# Patient Record
Sex: Female | Born: 1980 | Race: Black or African American | Hispanic: No | Marital: Single | Smoking: Never smoker
Health system: Southern US, Community
[De-identification: ages and names within clinical notes are randomized; demographics above are authoritative.]

## PROBLEM LIST (undated history)

## (undated) DIAGNOSIS — D649 Anemia, unspecified: Secondary | ICD-10-CM

## (undated) DIAGNOSIS — F329 Major depressive disorder, single episode, unspecified: Secondary | ICD-10-CM

## (undated) DIAGNOSIS — Q0700 Arnold-Chiari syndrome without spina bifida or hydrocephalus: Secondary | ICD-10-CM

## (undated) DIAGNOSIS — I10 Essential (primary) hypertension: Secondary | ICD-10-CM

## (undated) DIAGNOSIS — E669 Obesity, unspecified: Secondary | ICD-10-CM

## (undated) DIAGNOSIS — F32A Depression, unspecified: Secondary | ICD-10-CM

## (undated) DIAGNOSIS — F419 Anxiety disorder, unspecified: Secondary | ICD-10-CM

## (undated) DIAGNOSIS — G43909 Migraine, unspecified, not intractable, without status migrainosus: Secondary | ICD-10-CM

## (undated) HISTORY — DX: Anxiety disorder, unspecified: F41.9

## (undated) HISTORY — PX: OTHER SURGICAL HISTORY: SHX169

## (undated) HISTORY — DX: Major depressive disorder, single episode, unspecified: F32.9

## (undated) HISTORY — DX: Obesity, unspecified: E66.9

## (undated) HISTORY — DX: Arnold-Chiari syndrome without spina bifida or hydrocephalus: Q07.00

## (undated) HISTORY — DX: Depression, unspecified: F32.A

---
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
## 2001-02-26 ENCOUNTER — Encounter (INDEPENDENT_AMBULATORY_CARE_PROVIDER_SITE_OTHER): Payer: Self-pay | Admitting: *Deleted

## 2001-02-26 ENCOUNTER — Other Ambulatory Visit: Admission: RE | Admit: 2001-02-26 | Discharge: 2001-02-26 | Payer: Self-pay | Admitting: *Deleted

## 2001-02-26 ENCOUNTER — Encounter: Admission: RE | Admit: 2001-02-26 | Discharge: 2001-02-26 | Payer: Self-pay | Admitting: Family Medicine

## 2001-03-17 ENCOUNTER — Encounter: Admission: RE | Admit: 2001-03-17 | Discharge: 2001-03-17 | Payer: Self-pay | Admitting: Family Medicine

## 2001-04-21 ENCOUNTER — Encounter: Admission: RE | Admit: 2001-04-21 | Discharge: 2001-04-21 | Payer: Self-pay | Admitting: Family Medicine

## 2001-05-15 ENCOUNTER — Encounter: Admission: RE | Admit: 2001-05-15 | Discharge: 2001-05-15 | Payer: Self-pay | Admitting: Family Medicine

## 2001-07-28 ENCOUNTER — Encounter: Admission: RE | Admit: 2001-07-28 | Discharge: 2001-07-28 | Payer: Self-pay | Admitting: Family Medicine

## 2001-09-28 ENCOUNTER — Encounter: Admission: RE | Admit: 2001-09-28 | Discharge: 2001-09-28 | Payer: Self-pay | Admitting: Family Medicine

## 2001-12-09 ENCOUNTER — Encounter: Admission: RE | Admit: 2001-12-09 | Discharge: 2001-12-09 | Payer: Self-pay | Admitting: Family Medicine

## 2002-05-10 ENCOUNTER — Encounter: Admission: RE | Admit: 2002-05-10 | Discharge: 2002-05-10 | Payer: Self-pay | Admitting: Family Medicine

## 2002-08-13 ENCOUNTER — Encounter: Admission: RE | Admit: 2002-08-13 | Discharge: 2002-08-13 | Payer: Self-pay | Admitting: Family Medicine

## 2002-10-18 ENCOUNTER — Encounter: Payer: Self-pay | Admitting: Emergency Medicine

## 2002-10-18 ENCOUNTER — Emergency Department (HOSPITAL_COMMUNITY): Admission: EM | Admit: 2002-10-18 | Discharge: 2002-10-18 | Payer: Self-pay | Admitting: Emergency Medicine

## 2002-12-22 ENCOUNTER — Encounter: Admission: RE | Admit: 2002-12-22 | Discharge: 2002-12-22 | Payer: Self-pay | Admitting: Family Medicine

## 2002-12-29 ENCOUNTER — Encounter: Admission: RE | Admit: 2002-12-29 | Discharge: 2002-12-29 | Payer: Self-pay | Admitting: Family Medicine

## 2003-01-06 ENCOUNTER — Encounter: Admission: RE | Admit: 2003-01-06 | Discharge: 2003-01-06 | Payer: Self-pay | Admitting: Family Medicine

## 2003-01-13 ENCOUNTER — Encounter: Admission: RE | Admit: 2003-01-13 | Discharge: 2003-01-13 | Payer: Self-pay | Admitting: Family Medicine

## 2003-02-25 ENCOUNTER — Encounter: Admission: RE | Admit: 2003-02-25 | Discharge: 2003-02-25 | Payer: Self-pay | Admitting: Family Medicine

## 2003-07-01 ENCOUNTER — Encounter (INDEPENDENT_AMBULATORY_CARE_PROVIDER_SITE_OTHER): Payer: Self-pay | Admitting: Specialist

## 2003-07-01 ENCOUNTER — Encounter: Admission: RE | Admit: 2003-07-01 | Discharge: 2003-07-01 | Payer: Self-pay | Admitting: Family Medicine

## 2004-04-13 ENCOUNTER — Ambulatory Visit: Payer: Self-pay | Admitting: Family Medicine

## 2005-05-29 ENCOUNTER — Ambulatory Visit: Payer: Self-pay | Admitting: Family Medicine

## 2005-08-22 ENCOUNTER — Encounter (INDEPENDENT_AMBULATORY_CARE_PROVIDER_SITE_OTHER): Payer: Self-pay | Admitting: *Deleted

## 2005-08-22 LAB — CONVERTED CEMR LAB

## 2005-08-27 ENCOUNTER — Ambulatory Visit: Payer: Self-pay | Admitting: Sports Medicine

## 2005-08-27 ENCOUNTER — Other Ambulatory Visit: Admission: RE | Admit: 2005-08-27 | Discharge: 2005-08-27 | Payer: Self-pay | Admitting: Family Medicine

## 2006-08-14 ENCOUNTER — Ambulatory Visit: Payer: Self-pay | Admitting: Family Medicine

## 2006-09-05 ENCOUNTER — Encounter (INDEPENDENT_AMBULATORY_CARE_PROVIDER_SITE_OTHER): Payer: Self-pay | Admitting: Family Medicine

## 2006-09-05 ENCOUNTER — Encounter (INDEPENDENT_AMBULATORY_CARE_PROVIDER_SITE_OTHER): Payer: Self-pay | Admitting: *Deleted

## 2006-09-05 ENCOUNTER — Other Ambulatory Visit: Admission: RE | Admit: 2006-09-05 | Discharge: 2006-09-05 | Payer: Self-pay | Admitting: Family Medicine

## 2006-09-05 ENCOUNTER — Ambulatory Visit: Payer: Self-pay | Admitting: Family Medicine

## 2006-09-05 LAB — CONVERTED CEMR LAB
Chlamydia, DNA Probe: NEGATIVE
Cholesterol: 153 mg/dL (ref 0–200)
GC Probe Amp, Genital: NEGATIVE
HDL: 43 mg/dL (ref >39)
LDL Cholesterol: 97 mg/dL (ref 0–99)
Total CHOL/HDL Ratio: 3.6
Triglycerides: 64 mg/dL (ref <150)
VLDL: 13 mg/dL (ref 0–40)

## 2006-09-18 DIAGNOSIS — E669 Obesity, unspecified: Secondary | ICD-10-CM

## 2006-09-19 ENCOUNTER — Encounter (INDEPENDENT_AMBULATORY_CARE_PROVIDER_SITE_OTHER): Payer: Self-pay | Admitting: *Deleted

## 2006-10-31 ENCOUNTER — Ambulatory Visit: Payer: Self-pay | Admitting: Family Medicine

## 2007-01-09 ENCOUNTER — Ambulatory Visit: Payer: Self-pay | Admitting: Family Medicine

## 2007-04-07 ENCOUNTER — Encounter (INDEPENDENT_AMBULATORY_CARE_PROVIDER_SITE_OTHER): Payer: Self-pay | Admitting: Family Medicine

## 2007-04-07 ENCOUNTER — Ambulatory Visit: Payer: Self-pay | Admitting: Family Medicine

## 2007-04-07 DIAGNOSIS — I10 Essential (primary) hypertension: Secondary | ICD-10-CM

## 2007-04-07 DIAGNOSIS — R87610 Atypical squamous cells of undetermined significance on cytologic smear of cervix (ASC-US): Secondary | ICD-10-CM

## 2007-04-09 ENCOUNTER — Encounter (INDEPENDENT_AMBULATORY_CARE_PROVIDER_SITE_OTHER): Payer: Self-pay | Admitting: Family Medicine

## 2007-04-28 ENCOUNTER — Encounter (INDEPENDENT_AMBULATORY_CARE_PROVIDER_SITE_OTHER): Payer: Self-pay | Admitting: Family Medicine

## 2007-07-09 ENCOUNTER — Telehealth: Payer: Self-pay | Admitting: *Deleted

## 2007-07-14 ENCOUNTER — Ambulatory Visit: Payer: Self-pay | Admitting: Sports Medicine

## 2007-09-11 ENCOUNTER — Ambulatory Visit: Payer: Self-pay | Admitting: Family Medicine

## 2007-10-09 ENCOUNTER — Telehealth: Payer: Self-pay | Admitting: Family Medicine

## 2007-10-09 ENCOUNTER — Encounter: Payer: Self-pay | Admitting: Family Medicine

## 2007-10-09 ENCOUNTER — Ambulatory Visit: Payer: Self-pay | Admitting: Family Medicine

## 2007-10-09 DIAGNOSIS — N926 Irregular menstruation, unspecified: Secondary | ICD-10-CM | POA: Insufficient documentation

## 2007-10-09 LAB — CONVERTED CEMR LAB
Beta hcg, urine, semiquantitative: NEGATIVE
CO2: 25 meq/L (ref 19–32)
Chloride: 105 meq/L (ref 96–112)
GC Probe Amp, Genital: NEGATIVE
Sodium: 139 meq/L (ref 135–145)
Whiff Test: POSITIVE

## 2008-02-18 ENCOUNTER — Encounter (INDEPENDENT_AMBULATORY_CARE_PROVIDER_SITE_OTHER): Payer: Self-pay | Admitting: Family Medicine

## 2008-09-15 ENCOUNTER — Ambulatory Visit: Payer: Self-pay | Admitting: Family Medicine

## 2008-09-29 ENCOUNTER — Ambulatory Visit: Payer: Self-pay | Admitting: Family Medicine

## 2008-10-13 ENCOUNTER — Ambulatory Visit: Payer: Self-pay | Admitting: Family Medicine

## 2008-10-13 ENCOUNTER — Encounter (INDEPENDENT_AMBULATORY_CARE_PROVIDER_SITE_OTHER): Payer: Self-pay | Admitting: Family Medicine

## 2008-10-13 LAB — CONVERTED CEMR LAB
Pap Smear: NORMAL
Whiff Test: NEGATIVE

## 2008-10-14 ENCOUNTER — Encounter: Payer: Self-pay | Admitting: *Deleted

## 2008-10-14 LAB — CONVERTED CEMR LAB
ALT: 17 units/L (ref 0–35)
CO2: 24 meq/L (ref 19–32)
Calcium: 9 mg/dL (ref 8.4–10.5)
Chlamydia, DNA Probe: NEGATIVE
Chloride: 103 meq/L (ref 96–112)
Cholesterol: 140 mg/dL (ref 0–200)
Lymphocytes Relative: 44 % (ref 12–46)
Lymphs Abs: 2 10*3/uL (ref 0.7–4.0)
Monocytes Relative: 9 % (ref 3–12)
Neutro Abs: 2.1 10*3/uL (ref 1.7–7.7)
Neutrophils Relative %: 47 % (ref 43–77)
Potassium: 3.8 meq/L (ref 3.5–5.3)
RBC: 3.83 M/uL — ABNORMAL LOW (ref 3.87–5.11)
Sodium: 138 meq/L (ref 135–145)
Total Bilirubin: 0.5 mg/dL (ref 0.3–1.2)
Total Protein: 8.3 g/dL (ref 6.0–8.3)
VLDL: 16 mg/dL (ref 0–40)
WBC: 4.5 10*3/uL (ref 4.0–10.5)

## 2009-02-13 ENCOUNTER — Encounter: Admission: RE | Admit: 2009-02-13 | Discharge: 2009-02-13 | Payer: Self-pay | Admitting: Family Medicine

## 2009-09-11 ENCOUNTER — Encounter: Admission: RE | Admit: 2009-09-11 | Discharge: 2009-09-11 | Payer: Self-pay | Admitting: Family Medicine

## 2010-05-25 ENCOUNTER — Encounter: Payer: Self-pay | Admitting: Family Medicine

## 2010-08-21 NOTE — Miscellaneous (Signed)
  Clinical Lists Changes  Problems: Removed problem of MENORRHAGIA (ICD-626.2) Removed problem of SCREENING FOR MALIGNANT NEOPLASM OF THE CERVIX (ICD-V76.2) Removed problem of TRICHOMONAL VAGINITIS (ICD-131.01) Removed problem of SEXUALLY TRANSMITTED DISEASE, EXPOSURE TO (ICD-V01.6) Removed problem of VAGINAL DISCHARGE (ICD-623.5) Removed problem of SCREENING FOR MALIGNANT NEOPLASM OF THE CERVIX (ICD-V76.2) Removed problem of PAPANICOLAOU SMEAR, ABNORMAL (ICD-795.0) Removed problem of AMENORRHEA (ICD-626.0)

## 2012-10-15 ENCOUNTER — Ambulatory Visit: Payer: 59

## 2012-11-06 ENCOUNTER — Encounter (HOSPITAL_COMMUNITY): Payer: Self-pay

## 2012-11-06 ENCOUNTER — Emergency Department (HOSPITAL_COMMUNITY)
Admission: EM | Admit: 2012-11-06 | Discharge: 2012-11-06 | Disposition: A | Discharge status: Home / Self Care | Payer: 59 | Attending: Emergency Medicine | Admitting: Emergency Medicine

## 2012-11-06 DIAGNOSIS — K219 Gastro-esophageal reflux disease without esophagitis: Secondary | ICD-10-CM

## 2012-11-06 DIAGNOSIS — R1110 Vomiting, unspecified: Secondary | ICD-10-CM | POA: Insufficient documentation

## 2012-11-06 DIAGNOSIS — Z79899 Other long term (current) drug therapy: Secondary | ICD-10-CM | POA: Insufficient documentation

## 2012-11-06 DIAGNOSIS — I10 Essential (primary) hypertension: Secondary | ICD-10-CM | POA: Insufficient documentation

## 2012-11-06 DIAGNOSIS — Z3202 Encounter for pregnancy test, result negative: Secondary | ICD-10-CM | POA: Insufficient documentation

## 2012-11-06 HISTORY — DX: Essential (primary) hypertension: I10

## 2012-11-06 LAB — CBC
HCT: 32.4 % — ABNORMAL LOW (ref 36.0–46.0)
Hemoglobin: 10.6 g/dL — ABNORMAL LOW (ref 12.0–15.0)
MCH: 28.8 pg (ref 26.0–34.0)
MCV: 88 fL (ref 78.0–100.0)
RBC: 3.68 MIL/uL — ABNORMAL LOW (ref 3.87–5.11)

## 2012-11-06 LAB — URINALYSIS, ROUTINE W REFLEX MICROSCOPIC
Bilirubin Urine: NEGATIVE
Hgb urine dipstick: NEGATIVE
Specific Gravity, Urine: 1.017 (ref 1.005–1.030)
Urobilinogen, UA: 0.2 mg/dL (ref 0.0–1.0)
pH: 5.5 (ref 5.0–8.0)

## 2012-11-06 LAB — HEPATIC FUNCTION PANEL
ALT: 10 U/L (ref 0–35)
AST: 15 U/L (ref 0–37)
Bilirubin, Direct: <0.1 mg/dL (ref 0.0–0.3)
Total Bilirubin: 0.4 mg/dL (ref 0.3–1.2)

## 2012-11-06 LAB — BASIC METABOLIC PANEL
CO2: 27 mEq/L (ref 19–32)
Chloride: 105 mEq/L (ref 96–112)
Glucose, Bld: 92 mg/dL (ref 70–99)
Potassium: 3.6 mEq/L (ref 3.5–5.1)
Sodium: 138 mEq/L (ref 135–145)

## 2012-11-06 MED ORDER — OMEPRAZOLE 20 MG PO CPDR: 20.0000 mg | DELAYED_RELEASE_CAPSULE | Freq: Every day | ORAL | Status: DC

## 2012-11-06 MED ORDER — GI COCKTAIL ~~LOC~~
30.0000 mL | Freq: Once | ORAL | Status: AC
  Administered 2012-11-06: 30 mL via ORAL
  Filled 2012-11-06: qty 30

## 2012-11-06 NOTE — ED Notes (Signed)
Pt complains of upper abd pain since yesterday, has vomited once and felt a little better, but the pain became severe when she laid down.

## 2012-11-06 NOTE — ED Provider Notes (Signed)
Medical screening examination/treatment/procedure(s) were performed by non-physician practitioner and as supervising physician I was immediately available for consultation/collaboration.   Lyanne Co, MD 11/06/12 [phone]

## 2012-11-06 NOTE — ED Provider Notes (Signed)
History     CSN: [id number]  Arrival date & time 11/06/12  4098   First MD Initiated Contact with Patient 11/06/12 [phone]      Chief Complaint  Patient presents with  . Abdominal Pain    (Consider location/radiation/quality/duration/timing/severity/associated sxs/prior treatment) HPI Comments: Patient presents to the ED with a chief complaint of abdominal pain.  Patient states that the pain began last night and is located in the upper abdomen.  She states that it feels like indigestion.  She states that she vomited once, and then felt a little better.  She denies bloody or bilious vomit.  She states that the pain was better when she laid down.  She was able to fall asleep, but the pain eventually woke her up.  She states that the pain is 6/10 currently. The pain does not radiate.  It is intermittent.  She denies fever, chill, chest pain, SOB, diarrhea, and constipation.  The history is provided by the patient. No language interpreter was used.    Past Medical History  Diagnosis Date  . Hypertension     History reviewed. No pertinent past surgical history.  History reviewed. No pertinent family history.  History  Substance Use Topics  . Smoking status: Not on file  . Smokeless tobacco: Not on file  . Alcohol Use: No    OB History   Grav Para Term Preterm Abortions TAB SAB Ect Mult Living                  Review of Systems  All other systems reviewed and are negative.    Allergies  Review of patient's allergies indicates no known allergies.  Home Medications   Current Outpatient Rx  Name  Route  Sig  Dispense  Refill  . diphenhydrAMINE (BENADRYL) 25 MG tablet   Oral   Take 25 mg by mouth every 6 (six) hours as needed for allergies.         Marland Kitchen ibuprofen (ADVIL,MOTRIN) 800 MG tablet   Oral   Take 800 mg by mouth every 8 (eight) hours as needed for pain.         Marland Kitchen lisinopril (PRINIVIL,ZESTRIL) 10 MG tablet   Oral   Take 10 mg by mouth every morning.         . loperamide (IMODIUM) 2 MG capsule   Oral   Take 2 mg by mouth 2 (two) times daily as needed for diarrhea or loose stools.           BP 147/102  Pulse 82  Temp(Src) 98.4 F (36.9 C) (Oral)  Resp 20  Ht 5' 3.5" (1.613 m)  Wt 270 lb (122.471 kg)  BMI 47.07 kg/m2  SpO2 9%  LMP 11/06/2012  Physical Exam  Nursing note and vitals reviewed. Constitutional: She is oriented to person, place, and time. She appears well-developed and well-nourished.  HENT:  Head: Normocephalic and atraumatic.  Eyes: Conjunctivae and EOM are normal. Pupils are equal, round, and reactive to light.  Neck: Normal range of motion. Neck supple.  Cardiovascular: Normal rate and regular rhythm.  Exam reveals no gallop and no friction rub.   No murmur heard. Pulmonary/Chest: Effort normal and breath sounds normal. No respiratory distress. She has no wheezes. She has no rales. She exhibits no tenderness.  Abdominal: Soft. Bowel sounds are normal. She exhibits no distension and no mass. There is no tenderness. There is no rebound and no guarding.  No focal abdominal tenderness, no signs of surgical abdomen,  no reproducible pain on palpation  Musculoskeletal: Normal range of motion. She exhibits no edema and no tenderness.  Neurological: She is alert and oriented to person, place, and time.  Skin: Skin is warm and dry.  Psychiatric: She has a normal mood and affect. Her behavior is normal. Judgment and thought content normal.    ED Course  Procedures (including critical care time)  Labs Reviewed  BASIC METABOLIC PANEL - Abnormal; Notable for the following:    BUN 5 (*)    All other components within normal limits  CBC - Abnormal; Notable for the following:    RBC 3.68 (*)    Hemoglobin 10.6 (*)    HCT 32.4 (*)    All other components within normal limits  LIPASE, BLOOD - Abnormal; Notable for the following:    Lipase 10 (*)    All other components within normal limits  URINALYSIS, ROUTINE W REFLEX  MICROSCOPIC  PREGNANCY, URINE   Results for orders placed during the hospital encounter of 11/06/12  URINALYSIS, ROUTINE W REFLEX MICROSCOPIC      Result Value Range   Color, Urine YELLOW  YELLOW   APPearance CLEAR  CLEAR   Specific Gravity, Urine 1.017  1.005 - 1.030   pH 5.5  5.0 - 8.0   Glucose, UA NEGATIVE  NEGATIVE mg/dL   Hgb urine dipstick NEGATIVE  NEGATIVE   Bilirubin Urine NEGATIVE  NEGATIVE   Ketones, ur NEGATIVE  NEGATIVE mg/dL   Protein, ur NEGATIVE  NEGATIVE mg/dL   Urobilinogen, UA 0.2  0.0 - 1.0 mg/dL   Nitrite NEGATIVE  NEGATIVE   Leukocytes, UA NEGATIVE  NEGATIVE  PREGNANCY, URINE      Result Value Range   Preg Test, Ur NEGATIVE  NEGATIVE  BASIC METABOLIC PANEL      Result Value Range   Sodium 138  135 - 145 mEq/L   Potassium 3.6  3.5 - 5.1 mEq/L   Chloride 105  96 - 112 mEq/L   CO2 27  19 - 32 mEq/L   Glucose, Bld 92  70 - 99 mg/dL   BUN 5 (*) 6 - 23 mg/dL   Creatinine, Ser 1.61  0.50 - 1.10 mg/dL   Calcium 8.8  8.4 - 09.6 mg/dL   GFR calc non Af Amer >90  >90 mL/min   GFR calc Af Amer >90  >90 mL/min  CBC      Result Value Range   WBC 5.1  4.0 - 10.5 K/uL   RBC 3.68 (*) 3.87 - 5.11 MIL/uL   Hemoglobin 10.6 (*) 12.0 - 15.0 g/dL   HCT 04.5 (*) 40.9 - 81.1 %   MCV 88.0  78.0 - 100.0 fL   MCH 28.8  26.0 - 34.0 pg   MCHC 32.7  30.0 - 36.0 g/dL   RDW 91.4  78.2 - 95.6 %   Platelets 280  150 - 400 K/uL  LIPASE, BLOOD      Result Value Range   Lipase 10 (*) 11 - 59 U/L  HEPATIC FUNCTION PANEL      Result Value Range   Total Protein 7.1  6.0 - 8.3 g/dL   Albumin 3.0 (*) 3.5 - 5.2 g/dL   AST 15  0 - 37 U/L   ALT 10  0 - 35 U/L   Alkaline Phosphatase 50  39 - 117 U/L   Total Bilirubin 0.4  0.3 - 1.2 mg/dL   Bilirubin, Direct <2.1  0.0 - 0.3 mg/dL  Indirect Bilirubin NOT CALCULATED  0.3 - 0.9 mg/dL      1. GERD (gastroesophageal reflux disease)       MDM  Patient with epigastric tenderness.  Suspect that the patient's symptoms are gerd  related.  Will give GI cocktail and re-evaluate.  Will also order LFTs.  Lipase is no elevated.  8:15 AM Patient states that the GI cocktail significantly improved her symptoms.  She states that she eats a lot of spicy foods.  Will give the patient omeprazole, and will have the patient follow-up with GI or PCP.  Patient understands and agrees with the plan.  She is stable and ready for discharge.        Roxy Horseman, PA-C 11/06/12 [phone]

## 2013-03-26 ENCOUNTER — Encounter: Payer: Self-pay | Admitting: Internal Medicine

## 2013-03-26 ENCOUNTER — Ambulatory Visit: Payer: 59 | Attending: Internal Medicine | Admitting: Internal Medicine

## 2013-03-26 VITALS — BP 137/83 | HR 95 | Temp 99.3°F | Resp 18 | Ht 62.99 in | Wt 292.0 lb

## 2013-03-26 DIAGNOSIS — I10 Essential (primary) hypertension: Secondary | ICD-10-CM | POA: Insufficient documentation

## 2013-03-26 DIAGNOSIS — E669 Obesity, unspecified: Secondary | ICD-10-CM

## 2013-03-26 DIAGNOSIS — K219 Gastro-esophageal reflux disease without esophagitis: Secondary | ICD-10-CM | POA: Insufficient documentation

## 2013-03-26 DIAGNOSIS — N926 Irregular menstruation, unspecified: Secondary | ICD-10-CM

## 2013-03-26 LAB — LIPID PANEL
LDL Cholesterol: 71 mg/dL (ref 0–99)
VLDL: 28 mg/dL (ref 0–40)

## 2013-03-26 MED ORDER — OMEPRAZOLE 20 MG PO CPDR: 20.0000 mg | DELAYED_RELEASE_CAPSULE | Freq: Every day | ORAL | Status: DC | PRN

## 2013-03-26 MED ORDER — LISINOPRIL 10 MG PO TABS: 10.0000 mg | ORAL_TABLET | Freq: Every morning | ORAL | Status: DC

## 2013-03-26 NOTE — Progress Notes (Signed)
Pt is here to est care Needing refills on her BP med (Lisinopril)... Last had it last Monday Voices no concerns.... Alert w/no signs of acute distress.

## 2013-03-26 NOTE — Progress Notes (Signed)
Patient ID: Jeanette Santos, female   DOB: July 17, 1981, 32 y.o.   MRN: [id number] Patient Demographics  Jeanette Santos, is a 32 y.o. female  QMV:[id number]  MWU:[id number]  DOB - 01-24-1981  Chief Complaint  Patient presents with  . Establish Care        Subjective:   Jeanette Santos today is here to establish primary care.  Patient has no complaints except needs refills on her BP meds, has been over last 4 days. She is on lisinopril 10 mg daily.  She has occasional GERD due to taking ibuprofen for pain with her periods. She follows OB/GYN, Dr. Mikey Bussing. Last Pap smear this year was normal. Patient has No headache, No chest pain, No abdominal pain - No Nausea, No new weakness tingling or numbness, No Cough - SOB.   Objective:    Filed Vitals:   03/26/13 0918  BP: 137/83  Pulse: 95  Temp: 99.3 F (37.4 C)  TempSrc: Oral  Resp: 18  Height: 5' 2.99" (1.6 m)  Weight: 292 lb (132.45 kg)  SpO2: 98%     ALLERGIES:  No Known Allergies  PAST MEDICAL HISTORY: Past Medical History  Diagnosis Date  . Hypertension     PAST SURGICAL HISTORY: History reviewed. No pertinent past surgical history.  FAMILY HISTORY: No family history on file.  MEDICATIONS AT HOME: Prior to Admission medications   Medication Sig Start Date End Date Taking? Authorizing Provider  diphenhydrAMINE (BENADRYL) 25 MG tablet Take 25 mg by mouth every 6 (six) hours as needed for allergies.   Yes Historical Provider, MD  ibuprofen (ADVIL,MOTRIN) 800 MG tablet Take 800 mg by mouth every 8 (eight) hours as needed for pain.   Yes Historical Provider, MD  levonorgestrel-ethinyl estradiol (AVIANE,ALESSE,LESSINA) 0.1-20 MG-MCG tablet Take 1 tablet by mouth daily.   Yes Historical Provider, MD  lisinopril (PRINIVIL,ZESTRIL) 10 MG tablet Take 1 tablet (10 mg total) by mouth every morning. 03/26/13  Yes Danilyn Cocke Jenna Luo, MD  loperamide (IMODIUM) 2 MG capsule Take 2 mg by mouth 2 (two) times daily as needed for  diarrhea or loose stools.    Historical Provider, MD  omeprazole (PRILOSEC) 20 MG capsule Take 1 capsule (20 mg total) by mouth daily as needed (acid reflux/heart burn). 03/26/13   Magan Winnett Jenna Luo, MD    REVIEW OF SYSTEMS:  Constitutional:   No   Fevers, chills, fatigue.  HEENT:    No headaches, Sore throat,   Cardio-vascular: No chest pain,  Orthopnea, swelling in lower extremities, anasarca, palpitations  GI:  No abdominal pain, nausea, vomiting, diarrhea  Resp: No shortness of breath,  No coughing up of blood.No cough.No wheezing.  Skin:  no rash or lesions.  GU:  no dysuria, change in color of urine, no urgency or frequency.  No flank pain.  Musculoskeletal: No joint pain or swelling.  No decreased range of motion.  No back pain.  Psych: No change in mood or affect. No depression or anxiety.  No memory loss.   Exam  General appearance :Awake, alert, NAD, Speech Clear. HEENT: Atraumatic and Normocephalic, PERLA Neck: supple, no JVD. No cervical lymphadenopathy.  Chest: clear to auscultation bilaterally, no wheezing, rales or rhonchi CVS: S1 S2 regular, no murmurs.  Abdomen: Obese soft, NBS, NT, ND, no gaurding, rigidity or rebound. Extremities: No cyanosis, clubbing, B/L Lower Ext shows no edema,  Neurology: Awake alert, and oriented X 3, CN II-XII intact, Non focal Skin:No Rash or lesions Wounds: N/A    Data Review  Basic Metabolic Panel: No results found for this basename: NA, K, CL, CO2, GLUCOSE, BUN, CREATININE, CALCIUM, MG, PHOS,  in the last 168 hours Liver Function Tests: No results found for this basename: AST, ALT, ALKPHOS, BILITOT, PROT, ALBUMIN,  in the last 168 hours  CBC: No results found for this basename: WBC, NEUTROABS, HGB, HCT, MCV, PLT,  in the last 168 hours ------------------------------------------------------------------------------------------------------------------ No results found for this basename: HGBA1C,  in the last 72  hours ------------------------------------------------------------------------------------------------------------------ No results found for this basename: CHOL, HDL, LDLCALC, TRIG, CHOLHDL, LDLDIRECT,  in the last 72 hours ------------------------------------------------------------------------------------------------------------------ No results found for this basename: TSH, T4TOTAL, FREET3, T3FREE, THYROIDAB,  in the last 72 hours ------------------------------------------------------------------------------------------------------------------ No results found for this basename: VITAMINB12, FOLATE, FERRITIN, TIBC, IRON, RETICCTPCT,  in the last 72 hours  Coagulation profile  No results found for this basename: INR, PROTIME,  in the last 168 hours    Assessment & Plan   Active Problems: 1. Hypertension - Will refill lisinopril  2 occasional GERD: Refill Prilosec  3. Obesity - Counseled on diet and weight control, regular exercise, she states that she has started working out from the past one month and has lost 7 pounds  4. health maintenance: Last Pap smear this year, per patient was normal Declines flu shot   Labs reviewed was done in April 2014, anemia with hemoglobin of 10.6 at that time. Otherwise patient is asymptomatic doing well. Will check lipid panel  Recommendations:Lipid panel   Follow-up in 4 months   Amarri Michaelson M.D. 03/26/2013, 9:33 AM

## 2013-06-21 ENCOUNTER — Other Ambulatory Visit: Payer: Self-pay | Admitting: Orthopedic Surgery

## 2013-06-21 DIAGNOSIS — IMO0002 Reserved for concepts with insufficient information to code with codable children: Secondary | ICD-10-CM

## 2013-07-08 ENCOUNTER — Other Ambulatory Visit: Payer: 59

## 2013-07-17 ENCOUNTER — Ambulatory Visit
Admission: RE | Admit: 2013-07-17 | Discharge: 2013-07-17 | Disposition: A | Discharge status: Home / Self Care | Payer: 59 | Source: Ambulatory Visit | Attending: Orthopedic Surgery | Admitting: Orthopedic Surgery

## 2013-07-17 DIAGNOSIS — IMO0002 Reserved for concepts with insufficient information to code with codable children: Secondary | ICD-10-CM

## 2013-09-28 ENCOUNTER — Telehealth: Payer: Self-pay | Admitting: *Deleted

## 2013-09-28 ENCOUNTER — Telehealth: Payer: Self-pay | Admitting: Internal Medicine

## 2013-09-28 ENCOUNTER — Other Ambulatory Visit: Payer: Self-pay | Admitting: Internal Medicine

## 2013-09-28 NOTE — Telephone Encounter (Signed)
Left message

## 2013-09-28 NOTE — Telephone Encounter (Signed)
Pt says she needs refill for bp meds, [facility] [place] has sent refill requests but have not received response from clinic. Pt has scheduled office visit but says she is completely out of meds.

## 2013-10-04 ENCOUNTER — Ambulatory Visit: Payer: 59

## 2013-10-15 ENCOUNTER — Encounter: Payer: Self-pay | Admitting: Internal Medicine

## 2013-10-15 ENCOUNTER — Ambulatory Visit: Payer: 59 | Attending: Internal Medicine | Admitting: Internal Medicine

## 2013-10-15 VITALS — BP 130/86 | HR 76 | Temp 98.3°F | Resp 16

## 2013-10-15 DIAGNOSIS — J329 Chronic sinusitis, unspecified: Secondary | ICD-10-CM | POA: Insufficient documentation

## 2013-10-15 DIAGNOSIS — J3489 Other specified disorders of nose and nasal sinuses: Secondary | ICD-10-CM | POA: Insufficient documentation

## 2013-10-15 DIAGNOSIS — R0981 Nasal congestion: Secondary | ICD-10-CM

## 2013-10-15 MED ORDER — FLUTICASONE PROPIONATE 50 MCG/ACT NA SUSP: 2.0000 | Freq: Every day | NASAL | Status: DC

## 2013-10-15 NOTE — Progress Notes (Signed)
MRN: [id number] Name: Jeanette Santos  Sex: female Age: 33 y.o. DOB: [birth year]  Allergies: Review of patient's allergies indicates no known allergies.  Chief Complaint  Patient presents with  . Sinusitis    HPI: Patient is 33 y.o. female who comes today reported to have sinus trouble and as per patient she went to the urgent care recently was diagnosed with sinusitis and was discharged on cough medication and Zithromax which as per patient she is taking and there is improvement in the symptoms still has some nasal congestion and occasional cough her she feels some mucus draining down the back of throat, denies any fever chills chest pain shortness of breath. Patient denies smoking cigarettes.   Past Medical History  Diagnosis Date  . Hypertension     History reviewed. No pertinent past surgical history.    Medication List       This list is accurate as of: 10/15/13  3:11 PM.  Always use your most recent med list.               diphenhydrAMINE 25 MG tablet  Commonly known as:  BENADRYL  Take 25 mg by mouth every 6 (six) hours as needed for allergies.     fluticasone 50 MCG/ACT nasal spray  Commonly known as:  FLONASE  Place 2 sprays into both nostrils daily.     ibuprofen 800 MG tablet  Commonly known as:  ADVIL,MOTRIN  Take 800 mg by mouth every 8 (eight) hours as needed for pain.     levonorgestrel-ethinyl estradiol 0.1-20 MG-MCG tablet  Commonly known as:  AVIANE,ALESSE,LESSINA  Take 1 tablet by mouth daily.     lisinopril 10 MG tablet  Commonly known as:  PRINIVIL,ZESTRIL  TAKE 1 TABLET BY MOUTH ONCE EVERY MORNING     loperamide 2 MG capsule  Commonly known as:  IMODIUM  Take 2 mg by mouth 2 (two) times daily as needed for diarrhea or loose stools.     omeprazole 20 MG capsule  Commonly known as:  PRILOSEC  Take 1 capsule (20 mg total) by mouth daily as needed (acid reflux/heart burn).        Meds ordered this encounter  Medications  .  fluticasone (FLONASE) 50 MCG/ACT nasal spray    Sig: Place 2 sprays into both nostrils daily.    Dispense:  16 g    Refill:  3    Immunization History  Administered Date(s) Administered  . Hpv 10/31/2006, 01/09/2007, 07/14/2007  . Td 12/21/2002    History reviewed. No pertinent family history.  History  Substance Use Topics  . Smoking status: Never Smoker   . Smokeless tobacco: Not on file  . Alcohol Use: No    Review of Systems   As noted in HPI  Filed Vitals:   10/15/13 1458  BP: 130/86  Pulse: 76  Temp: 98.3 F (36.8 C)  Resp: 16    Physical Exam  Physical Exam  HENT:  Nasal congestion no sinus tenderness, minimal pharyngeal erythema enlarged tonsils no exudate   Eyes: EOM are normal. Pupils are equal, round, and reactive to light.  Neck: Neck supple.  Cardiovascular: Normal rate and regular rhythm.   Pulmonary/Chest: Breath sounds normal. No respiratory distress. She has no wheezes. She has no rales.    CBC    Component Value Date/Time   WBC 5.1 11/06/2012 0510   RBC 3.68* 11/06/2012 0510   HGB 10.6* 11/06/2012 0510   HCT 32.4* 11/06/2012 0510  PLT 280 11/06/2012 0510   MCV 88.0 11/06/2012 0510   LYMPHSABS 2.0 10/13/2008 2020   MONOABS 0.4 10/13/2008 2020   EOSABS 0.0 10/13/2008 2020   BASOSABS 0.0 10/13/2008 2020    CMP     Component Value Date/Time   NA 138 11/06/2012 0510   K 3.6 11/06/2012 0510   CL 105 11/06/2012 0510   CO2 27 11/06/2012 0510   GLUCOSE 92 11/06/2012 0510   BUN 5* 11/06/2012 0510   CREATININE 0.77 11/06/2012 0510   CALCIUM 8.8 11/06/2012 0510   PROT 7.1 11/06/2012 0510   ALBUMIN 3.0* 11/06/2012 0510   AST 15 11/06/2012 0510   ALT 10 11/06/2012 0510   ALKPHOS 50 11/06/2012 0510   BILITOT 0.4 11/06/2012 0510   GFRNONAA >90 11/06/2012 0510   GFRAA >90 11/06/2012 0510    Lab Results  Component Value Date/Time   CHOL 139 03/26/2013  9:38 AM    No components found with this basename: hga1c    Lab Results  Component Value Date/Time    AST 15 11/06/2012  5:10 AM    Assessment and Plan  Sinusitis Advised patient to continue and finish the course of antibiotic, cough medication when necessary.  Stuffy nose - Plan: Patient will try fluticasone (FLONASE) 50 MCG/ACT nasal spray Also advise patient to continue with salt water gargles.  Return if symptoms worsen or fail to improve.  Doris Cheadle, MD

## 2013-10-15 NOTE — Progress Notes (Signed)
Patient here for follow up visit Was recently seen in the urgent care and diagnosed with sinus Infection Currently taking z pack and cough medicine

## 2013-10-19 ENCOUNTER — Telehealth: Payer: Self-pay | Admitting: Internal Medicine

## 2013-10-19 NOTE — Telephone Encounter (Signed)
Pt has come in today to drop off FMLA paperwork to be filled out by Dr. Orpah CobbAdvani; Paperwork has to be faxed upon completion; please f/u with pt after paper work has been completed @[phone]; Paper work is located in Dr. Ricki RodriguezAdvani's Mailbox (1:16pm)

## 2013-10-20 ENCOUNTER — Other Ambulatory Visit: Payer: Self-pay

## 2013-10-20 NOTE — Progress Notes (Unsigned)
Patient had dropped off paperwork for FMLA Paper work filled out Copy made to be scanned into chart

## 2013-12-22 ENCOUNTER — Encounter: Payer: Self-pay | Admitting: Internal Medicine

## 2013-12-22 ENCOUNTER — Ambulatory Visit: Payer: 59 | Admitting: Internal Medicine

## 2013-12-22 NOTE — Progress Notes (Signed)
Patient here to discuss medication options for weight loss.  Patient left before seeing provider. Stated she had to go home and let someone in to her house. Provider aware.

## 2014-02-09 ENCOUNTER — Encounter: Payer: Self-pay | Admitting: *Deleted

## 2014-02-11 ENCOUNTER — Ambulatory Visit (INDEPENDENT_AMBULATORY_CARE_PROVIDER_SITE_OTHER): Payer: 59 | Admitting: Neurology

## 2014-02-11 ENCOUNTER — Encounter: Payer: Self-pay | Admitting: Neurology

## 2014-02-11 VITALS — BP 152/109 | HR 105 | Wt 294.0 lb

## 2014-02-11 DIAGNOSIS — Q0700 Arnold-Chiari syndrome without spina bifida or hydrocephalus: Secondary | ICD-10-CM

## 2014-02-11 DIAGNOSIS — Q054 Unspecified spina bifida with hydrocephalus: Secondary | ICD-10-CM

## 2014-02-11 HISTORY — DX: Arnold-Chiari syndrome without spina bifida or hydrocephalus: Q07.00
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
## 2014-04-04 ENCOUNTER — Encounter (HOSPITAL_COMMUNITY): Payer: Self-pay | Admitting: *Deleted

## 2014-04-04 ENCOUNTER — Inpatient Hospital Stay (HOSPITAL_COMMUNITY)
Admission: AD | Admit: 2014-04-04 | Discharge: 2014-04-04 | Disposition: A | Discharge status: Home / Self Care | Payer: 59 | Source: Ambulatory Visit | Attending: Family Medicine | Admitting: Family Medicine

## 2014-04-04 DIAGNOSIS — I10 Essential (primary) hypertension: Secondary | ICD-10-CM | POA: Insufficient documentation

## 2014-04-04 DIAGNOSIS — D649 Anemia, unspecified: Secondary | ICD-10-CM | POA: Diagnosis not present

## 2014-04-04 DIAGNOSIS — N938 Other specified abnormal uterine and vaginal bleeding: Secondary | ICD-10-CM | POA: Diagnosis present

## 2014-04-04 DIAGNOSIS — N949 Unspecified condition associated with female genital organs and menstrual cycle: Secondary | ICD-10-CM | POA: Diagnosis present

## 2014-04-04 DIAGNOSIS — N925 Other specified irregular menstruation: Secondary | ICD-10-CM | POA: Insufficient documentation

## 2014-04-04 DIAGNOSIS — N921 Excessive and frequent menstruation with irregular cycle: Secondary | ICD-10-CM

## 2014-04-04 HISTORY — DX: Anemia, unspecified: D64.9

## 2014-04-04 LAB — URINALYSIS, ROUTINE W REFLEX MICROSCOPIC
Bilirubin Urine: NEGATIVE
Glucose, UA: NEGATIVE mg/dL
Ketones, ur: NEGATIVE mg/dL
Leukocytes, UA: NEGATIVE
Nitrite: NEGATIVE
Protein, ur: NEGATIVE mg/dL
Specific Gravity, Urine: 1.02 (ref 1.005–1.030)
Urobilinogen, UA: 0.2 mg/dL (ref 0.0–1.0)
pH: 6 (ref 5.0–8.0)

## 2014-04-04 LAB — CBC
HEMATOCRIT: 36.1 % (ref 36.0–46.0)
Hemoglobin: 11.8 g/dL — ABNORMAL LOW (ref 12.0–15.0)
MCH: 29.2 pg (ref 26.0–34.0)
MCHC: 32.7 g/dL (ref 30.0–36.0)
MCV: 89.4 fL (ref 78.0–100.0)
PLATELETS: 337 10*3/uL (ref 150–400)
RBC: 4.04 MIL/uL (ref 3.87–5.11)
RDW: 14.2 % (ref 11.5–15.5)
WBC: 5.5 10*3/uL (ref 4.0–10.5)

## 2014-04-04 LAB — URINE MICROSCOPIC-ADD ON

## 2014-04-04 LAB — POCT PREGNANCY, URINE: Preg Test, Ur: NEGATIVE

## 2014-04-04 MED ORDER — MEDROXYPROGESTERONE ACETATE 10 MG PO TABS: 10.0000 mg | ORAL_TABLET | Freq: Every day | ORAL | Status: DC

## 2014-04-04 NOTE — Discharge Instructions (Signed)
Abnormal Uterine Bleeding Abnormal uterine bleeding can affect women at various stages in life, including teenagers, women in their reproductive years, pregnant women, and women who have reached menopause. Several kinds of uterine bleeding are considered abnormal, including:  Bleeding or spotting between periods.   Bleeding after sexual intercourse.   Bleeding that is heavier or more than normal.   Periods that last longer than usual.  Bleeding after menopause.  Many cases of abnormal uterine bleeding are minor and simple to treat, while others are more serious. Any type of abnormal bleeding should be evaluated by your health care provider. Treatment will depend on the cause of the bleeding. HOME CARE INSTRUCTIONS Monitor your condition for any changes. The following actions may help to alleviate any discomfort you are experiencing:  Avoid the use of tampons and douches as directed by your health care provider.  Change your pads frequently. You should get regular pelvic exams and Pap tests. Keep all follow-up appointments for diagnostic tests as directed by your health care provider.  SEEK MEDICAL CARE IF:   Your bleeding lasts more than 1 week.   You feel dizzy at times.  SEEK IMMEDIATE MEDICAL CARE IF:   You pass out.   You are changing pads every 15 to 30 minutes.   You have abdominal pain.  You have a fever.   You become sweaty or weak.   You are passing large blood clots from the vagina.   You start to feel nauseous and vomit. MAKE SURE YOU:   Understand these instructions.  Will watch your condition.  Will get help right away if you are not doing well or get worse. Document Released: 07/08/2005 Document Revised: 07/13/2013 Document Reviewed: 02/04/2013 ExitCare Patient Information 2015 [employer]. This information is not intended to replace advice given to you by your health care provider. Make sure you discuss any questions you have with your  health care provider.  

## 2014-04-04 NOTE — MAU Note (Signed)
Has been bleeding off and on since 08/22.  Had already had period in Aug.

## 2014-04-04 NOTE — MAU Provider Note (Signed)
History     CSN: [id number]  Arrival date and time: 04/04/14 1045   None     Chief Complaint  Patient presents with  . Vaginal Bleeding   HPI This is a 33 y.o. female who presents with c/o spotting off and on since ending her period in early August. States usually has normal regular periods. Did have one episode like this a few years back after coming off DepoProvera. Which was treated with a Provera Challenge. Denies pain. Just had an annual exam in February with Dr Pennie Rushing. Got a single day off today and could not get an appointment there.  Is trying to get pregnant.   RN Note;  Has been bleeding off and on since 08/22. Had already had period in Aug.        OB History   Grav Para Term Preterm Abortions TAB SAB Ect Mult Living                  Past Medical History  Diagnosis Date  . Hypertension   . Obesity   . Arnold-Chiari malformation 02/11/2014  . Anemia     Past Surgical History  Procedure Laterality Date  . None      Family History  Problem Relation Age of Onset  . Hypertension Mother     History  Substance Use Topics  . Smoking status: Never Smoker   . Smokeless tobacco: Never Used  . Alcohol Use: Yes     Comment: less than 5 times per week    Allergies: No Known Allergies  Prescriptions prior to admission  Medication Sig Dispense Refill  . fluticasone (FLONASE) 50 MCG/ACT nasal spray Place 2 sprays into both nostrils daily.  16 g  3  . ibuprofen (ADVIL,MOTRIN) 800 MG tablet Take 800 mg by mouth every 8 (eight) hours as needed for pain.      Marland Kitchen lisinopril (PRINIVIL,ZESTRIL) 10 MG tablet TAKE 1 TABLET BY MOUTH ONCE EVERY MORNING  30 tablet  5  . traMADol (ULTRAM) 50 MG tablet Take 50 mg by mouth every 6 (six) hours as needed.        Review of Systems  Constitutional: Negative for fever, chills and malaise/fatigue.  Gastrointestinal: Negative for nausea, vomiting and abdominal pain.  Genitourinary: Negative for dysuria.       Spotting    Neurological: Negative for dizziness.   Physical Exam   Blood pressure 149/93, pulse 96, temperature 98.9 F (37.2 C), temperature source Oral, resp. rate 18, height  (1.6 m), weight 298 lb (135.172 kg), last menstrual period 03/12/2014.  Physical Exam  Constitutional: She is oriented to person, place, and time. She appears well-developed and well-nourished. No distress.  Cardiovascular: Normal rate.   Respiratory: Effort normal.  GI: Soft. She exhibits no distension. There is no tenderness. There is no rebound and no guarding.  Genitourinary: Vaginal discharge (scant blood) found.  Uterus small and nontender Adnexae nontender  Musculoskeletal: Normal range of motion.  Neurological: She is alert and oriented to person, place, and time.  Skin: Skin is warm and dry.  Psychiatric: She has a normal mood and affect.    MAU Course  Procedures  MDM Results for orders placed during the hospital encounter of 04/04/14 (from the past 24 hour(s))  URINALYSIS, ROUTINE W REFLEX MICROSCOPIC     Status: Abnormal   Collection Time    04/04/14 11:05 AM      Result Value Ref Range   Color, Urine YELLOW  YELLOW  APPearance CLEAR  CLEAR   Specific Gravity, Urine 1.020  1.005 - 1.030   pH 6.0  5.0 - 8.0   Glucose, UA NEGATIVE  NEGATIVE mg/dL   Hgb urine dipstick MODERATE (*) NEGATIVE   Bilirubin Urine NEGATIVE  NEGATIVE   Ketones, ur NEGATIVE  NEGATIVE mg/dL   Protein, ur NEGATIVE  NEGATIVE mg/dL   Urobilinogen, UA 0.2  0.0 - 1.0 mg/dL   Nitrite NEGATIVE  NEGATIVE   Leukocytes, UA NEGATIVE  NEGATIVE  URINE MICROSCOPIC-ADD ON     Status: None   Collection Time    04/04/14 11:05 AM      Result Value Ref Range   Squamous Epithelial / LPF RARE  RARE   WBC, UA 0-2  <3 WBC/hpf   RBC / HPF 0-2  <3 RBC/hpf  POCT PREGNANCY, URINE     Status: None   Collection Time    04/04/14 11:16 AM      Result Value Ref Range   Preg Test, Ur NEGATIVE  NEGATIVE  CBC     Status: Abnormal   Collection  Time    04/04/14 12:00 PM      Result Value Ref Range   WBC 5.5  4.0 - 10.5 K/uL   RBC 4.04  3.87 - 5.11 MIL/uL   Hemoglobin 11.8 (*) 12.0 - 15.0 g/dL   HCT 16.1  09.6 - 04.5 %   MCV 89.4  78.0 - 100.0 fL   MCH 29.2  26.0 - 34.0 pg   MCHC 32.7  30.0 - 36.0 g/dL   RDW 40.9  81.1 - 91.4 %   Platelets 337  150 - 400 K/uL     Assessment and Plan  A:  Dysfunctional Uterine Bleeding, probably anovulatory cycle    P:  Discussed menstrual physiology       Offered expectant management vs Provera Challenge,vs OCPs       Patient elects Provera Challenge       Rx Provera  po qd x 10 d       Patient plans to make appointment with Dr Pennie Rushing next month  [facility] 04/04/2014, 12:13 PM

## 2014-04-11 ENCOUNTER — Ambulatory Visit: Payer: 59 | Admitting: Internal Medicine

## 2014-04-13 ENCOUNTER — Telehealth: Payer: Self-pay | Admitting: Internal Medicine

## 2014-04-13 NOTE — Telephone Encounter (Signed)
Patient needs refill for lisinopril 10 mg, Patient haven't had any for the last three days. She missed her appointment because she didn't remember it. Please f/u with Pt

## 2014-04-14 ENCOUNTER — Telehealth: Payer: Self-pay | Admitting: Internal Medicine

## 2014-04-14 NOTE — Telephone Encounter (Signed)
Patient called again asking medication refill for her blood pressure ASAP, because she doesn't have any pill. Patient had to cancel her appt because her job. Please f/u with Patient

## 2014-04-22 ENCOUNTER — Ambulatory Visit: Payer: 59 | Admitting: Internal Medicine

## 2015-01-23 ENCOUNTER — Emergency Department (HOSPITAL_COMMUNITY)
Admission: EM | Admit: 2015-01-23 | Discharge: 2015-01-23 | Disposition: A | Discharge status: Home / Self Care | Payer: No Typology Code available for payment source | Attending: Emergency Medicine | Admitting: Emergency Medicine

## 2015-01-23 ENCOUNTER — Encounter (HOSPITAL_COMMUNITY): Payer: Self-pay | Admitting: Emergency Medicine

## 2015-01-23 ENCOUNTER — Emergency Department (HOSPITAL_COMMUNITY): Payer: No Typology Code available for payment source

## 2015-01-23 DIAGNOSIS — S161XXA Strain of muscle, fascia and tendon at neck level, initial encounter: Secondary | ICD-10-CM | POA: Diagnosis not present

## 2015-01-23 DIAGNOSIS — Z79899 Other long term (current) drug therapy: Secondary | ICD-10-CM | POA: Diagnosis not present

## 2015-01-23 DIAGNOSIS — Z7951 Long term (current) use of inhaled steroids: Secondary | ICD-10-CM | POA: Insufficient documentation

## 2015-01-23 DIAGNOSIS — Y999 Unspecified external cause status: Secondary | ICD-10-CM | POA: Insufficient documentation

## 2015-01-23 DIAGNOSIS — S79911A Unspecified injury of right hip, initial encounter: Secondary | ICD-10-CM | POA: Insufficient documentation

## 2015-01-23 DIAGNOSIS — S3992XA Unspecified injury of lower back, initial encounter: Secondary | ICD-10-CM | POA: Insufficient documentation

## 2015-01-23 DIAGNOSIS — S63619A Unspecified sprain of unspecified finger, initial encounter: Secondary | ICD-10-CM

## 2015-01-23 DIAGNOSIS — Y92410 Unspecified street and highway as the place of occurrence of the external cause: Secondary | ICD-10-CM | POA: Insufficient documentation

## 2015-01-23 DIAGNOSIS — S63611A Unspecified sprain of left index finger, initial encounter: Secondary | ICD-10-CM | POA: Diagnosis not present

## 2015-01-23 DIAGNOSIS — Y9389 Activity, other specified: Secondary | ICD-10-CM | POA: Insufficient documentation

## 2015-01-23 DIAGNOSIS — S6992XA Unspecified injury of left wrist, hand and finger(s), initial encounter: Secondary | ICD-10-CM | POA: Diagnosis present

## 2015-01-23 MED ORDER — HYDROCODONE-ACETAMINOPHEN 5-325 MG PO TABS
1.0000 | ORAL_TABLET | Freq: Once | ORAL | Status: AC
  Administered 2015-01-23: 1 via ORAL
  Filled 2015-01-23: qty 1

## 2015-01-23 MED ORDER — CYCLOBENZAPRINE HCL 10 MG PO TABS
10.0000 mg | ORAL_TABLET | Freq: Once | ORAL | Status: AC
  Administered 2015-01-23: 10 mg via ORAL
  Filled 2015-01-23: qty 1

## 2015-01-23 MED ORDER — CYCLOBENZAPRINE HCL 10 MG PO TABS: 10.0000 mg | ORAL_TABLET | Freq: Two times a day (BID) | ORAL | Status: DC | PRN

## 2015-01-23 MED ORDER — NAPROXEN 500 MG PO TABS: 500.0000 mg | ORAL_TABLET | Freq: Two times a day (BID) | ORAL | Status: DC

## 2015-01-23 MED ORDER — NAPROXEN 500 MG PO TABS
500.0000 mg | ORAL_TABLET | Freq: Once | ORAL | Status: AC
  Administered 2015-01-23: 500 mg via ORAL
  Filled 2015-01-23: qty 1

## 2015-01-23 MED ORDER — TRAMADOL HCL 50 MG PO TABS: 50.0000 mg | ORAL_TABLET | Freq: Four times a day (QID) | ORAL | Status: DC | PRN
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
## 2015-01-26 ENCOUNTER — Encounter (HOSPITAL_COMMUNITY): Payer: Self-pay | Admitting: Emergency Medicine

## 2015-01-26 ENCOUNTER — Emergency Department (HOSPITAL_COMMUNITY)
Admission: EM | Admit: 2015-01-26 | Discharge: 2015-01-26 | Disposition: A | Discharge status: Home / Self Care | Payer: 59 | Attending: Emergency Medicine | Admitting: Emergency Medicine

## 2015-01-26 DIAGNOSIS — Z7951 Long term (current) use of inhaled steroids: Secondary | ICD-10-CM | POA: Diagnosis not present

## 2015-01-26 DIAGNOSIS — E669 Obesity, unspecified: Secondary | ICD-10-CM | POA: Insufficient documentation

## 2015-01-26 DIAGNOSIS — S161XXD Strain of muscle, fascia and tendon at neck level, subsequent encounter: Secondary | ICD-10-CM | POA: Diagnosis not present

## 2015-01-26 DIAGNOSIS — S199XXD Unspecified injury of neck, subsequent encounter: Secondary | ICD-10-CM | POA: Diagnosis present

## 2015-01-26 DIAGNOSIS — Z79899 Other long term (current) drug therapy: Secondary | ICD-10-CM | POA: Diagnosis not present

## 2015-01-26 DIAGNOSIS — Z862 Personal history of diseases of the blood and blood-forming organs and certain disorders involving the immune mechanism: Secondary | ICD-10-CM | POA: Insufficient documentation

## 2015-01-26 DIAGNOSIS — I10 Essential (primary) hypertension: Secondary | ICD-10-CM | POA: Diagnosis not present
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
## 2015-01-26 NOTE — Discharge Instructions (Signed)
Cervical Strain and Sprain (Whiplash) with Rehab Cervical strain and sprain are injuries that commonly occur with "whiplash" injuries. Whiplash occurs when the neck is forcefully whipped backward or forward, such as during a motor vehicle accident or during contact sports. The muscles, ligaments, tendons, discs, and nerves of the neck are susceptible to injury when this occurs. RISK FACTORS Risk of having a whiplash injury increases if:  Osteoarthritis of the spine.  Situations that make head or neck accidents or trauma more likely.  High-risk sports (football, rugby, wrestling, hockey, auto racing, gymnastics, diving, contact karate, or boxing).  Poor strength and flexibility of the neck.  Previous neck injury.  Poor tackling technique.  Improperly fitted or padded equipment. SYMPTOMS   Pain or stiffness in the front or back of neck or both.  Symptoms may present immediately or up to 24 hours after injury.  Dizziness, headache, nausea, and vomiting.  Muscle spasm with soreness and stiffness in the neck.  Tenderness and swelling at the injury site. PREVENTION  Learn and use proper technique (avoid tackling with the head, spearing, and head-butting; use proper falling techniques to avoid landing on the head).  Warm up and stretch properly before activity.  Maintain physical fitness:  Strength, flexibility, and endurance.  Cardiovascular fitness.  Wear properly fitted and padded protective equipment, such as padded soft collars, for participation in contact sports. PROGNOSIS  Recovery from cervical strain and sprain injuries is dependent on the extent of the injury. These injuries are usually curable in 1 week to 3 months with appropriate treatment.  RELATED COMPLICATIONS   Temporary numbness and weakness may occur if the nerve roots are damaged, and this may persist until the nerve has completely healed.  Chronic pain due to frequent recurrence of  symptoms.  Prolonged healing, especially if activity is resumed too soon (before complete recovery). TREATMENT  Treatment initially involves the use of ice and medication to help reduce pain and inflammation. It is also important to perform strengthening and stretching exercises and modify activities that worsen symptoms so the injury does not get worse. These exercises may be performed at home or with a therapist. For patients who experience severe symptoms, a soft, padded collar may be recommended to be worn around the neck.  Improving your posture may help reduce symptoms. Posture improvement includes pulling your chin and abdomen in while sitting or standing. If you are sitting, sit in a firm chair with your buttocks against the back of the chair. While sleeping, try replacing your pillow with a small towel rolled to 2 inches in diameter, or use a cervical pillow or soft cervical collar. Poor sleeping positions delay healing.  For patients with nerve root damage, which causes numbness or weakness, the use of a cervical traction apparatus may be recommended. Surgery is rarely necessary for these injuries. However, cervical strain and sprains that are present at birth (congenital) may require surgery. MEDICATION   If pain medication is necessary, nonsteroidal anti-inflammatory medications, such as aspirin and ibuprofen, or other minor pain relievers, such as acetaminophen, are often recommended.  Do not take pain medication for 7 days before surgery.  Prescription pain relievers may be given if deemed necessary by your caregiver. Use only as directed and only as much as you need. HEAT AND COLD:   Cold treatment (icing) relieves pain and reduces inflammation. Cold treatment should be applied for 10 to 15 minutes every 2 to 3 hours for inflammation and pain and immediately after any activity that aggravates   your symptoms. Use ice packs or an ice massage.  Heat treatment may be used prior to  performing the stretching and strengthening activities prescribed by your caregiver, physical therapist, or athletic trainer. Use a heat pack or a warm soak. SEEK MEDICAL CARE IF:   Symptoms get worse or do not improve in 2 weeks despite treatment.  New, unexplained symptoms develop (drugs used in treatment may produce side effects). EXERCISES RANGE OF MOTION (ROM) AND STRETCHING EXERCISES - Cervical Strain and Sprain These exercises may help you when beginning to rehabilitate your injury. In order to successfully resolve your symptoms, you must improve your posture. These exercises are designed to help reduce the forward-head and rounded-shoulder posture which contributes to this condition. Your symptoms may resolve with or without further involvement from your physician, physical therapist or athletic trainer. While completing these exercises, remember:   Restoring tissue flexibility helps normal motion to return to the joints. This allows healthier, less painful movement and activity.  An effective stretch should be held for at least 20 seconds, although you may need to begin with shorter hold times for comfort.  A stretch should never be painful. You should only feel a gentle lengthening or release in the stretched tissue. STRETCH- Axial Extensors  Lie on your back on the floor. You may bend your knees for comfort. Place a rolled-up hand towel or dish towel, about 2 inches in diameter, under the part of your head that makes contact with the floor.  Gently tuck your chin, as if trying to make a "double chin," until you feel a gentle stretch at the base of your head.  Hold __________ seconds. Repeat __________ times. Complete this exercise __________ times per day.  STRETCH - Axial Extension   Stand or sit on a firm surface. Assume a good posture: chest up, shoulders drawn back, abdominal muscles slightly tense, knees unlocked (if standing) and feet hip width apart.  Slowly retract your  chin so your head slides back and your chin slightly lowers. Continue to look straight ahead.  You should feel a gentle stretch in the back of your head. Be certain not to feel an aggressive stretch since this can cause headaches later.  Hold for __________ seconds. Repeat __________ times. Complete this exercise __________ times per day. STRETCH - Cervical Side Bend   Stand or sit on a firm surface. Assume a good posture: chest up, shoulders drawn back, abdominal muscles slightly tense, knees unlocked (if standing) and feet hip width apart.  Without letting your nose or shoulders move, slowly tip your right / left ear to your shoulder until your feel a gentle stretch in the muscles on the opposite side of your neck.  Hold __________ seconds. Repeat __________ times. Complete this exercise __________ times per day. STRETCH - Cervical Rotators   Stand or sit on a firm surface. Assume a good posture: chest up, shoulders drawn back, abdominal muscles slightly tense, knees unlocked (if standing) and feet hip width apart.  Keeping your eyes level with the ground, slowly turn your head until you feel a gentle stretch along the back and opposite side of your neck.  Hold __________ seconds. Repeat __________ times. Complete this exercise __________ times per day. RANGE OF MOTION - Neck Circles   Stand or sit on a firm surface. Assume a good posture: chest up, shoulders drawn back, abdominal muscles slightly tense, knees unlocked (if standing) and feet hip width apart.  Gently roll your head down and around from the   back of one shoulder to the back of the other. The motion should never be forced or painful.  Repeat the motion 10-20 times, or until you feel the neck muscles relax and loosen. Repeat __________ times. Complete the exercise __________ times per day. STRENGTHENING EXERCISES - Cervical Strain and Sprain These exercises may help you when beginning to rehabilitate your injury. They may  resolve your symptoms with or without further involvement from your physician, physical therapist, or athletic trainer. While completing these exercises, remember:   Muscles can gain both the endurance and the strength needed for everyday activities through controlled exercises.  Complete these exercises as instructed by your physician, physical therapist, or athletic trainer. Progress the resistance and repetitions only as guided.  You may experience muscle soreness or fatigue, but the pain or discomfort you are trying to eliminate should never worsen during these exercises. If this pain does worsen, stop and make certain you are following the directions exactly. If the pain is still present after adjustments, discontinue the exercise until you can discuss the trouble with your clinician. STRENGTH - Cervical Flexors, Isometric  Face a wall, standing about 6 inches away. Place a small pillow, a ball about 6-8 inches in diameter, or a folded towel between your forehead and the wall.  Slightly tuck your chin and gently push your forehead into the soft object. Push only with mild to moderate intensity, building up tension gradually. Keep your jaw and forehead relaxed.  Hold 10 to 20 seconds. Keep your breathing relaxed.  Release the tension slowly. Relax your neck muscles completely before you start the next repetition. Repeat __________ times. Complete this exercise __________ times per day. STRENGTH- Cervical Lateral Flexors, Isometric   Stand about 6 inches away from a wall. Place a small pillow, a ball about 6-8 inches in diameter, or a folded towel between the side of your head and the wall.  Slightly tuck your chin and gently tilt your head into the soft object. Push only with mild to moderate intensity, building up tension gradually. Keep your jaw and forehead relaxed.  Hold 10 to 20 seconds. Keep your breathing relaxed.  Release the tension slowly. Relax your neck muscles completely  before you start the next repetition. Repeat __________ times. Complete this exercise __________ times per day. STRENGTH - Cervical Extensors, Isometric   Stand about 6 inches away from a wall. Place a small pillow, a ball about 6-8 inches in diameter, or a folded towel between the back of your head and the wall.  Slightly tuck your chin and gently tilt your head back into the soft object. Push only with mild to moderate intensity, building up tension gradually. Keep your jaw and forehead relaxed.  Hold 10 to 20 seconds. Keep your breathing relaxed.  Release the tension slowly. Relax your neck muscles completely before you start the next repetition. Repeat __________ times. Complete this exercise __________ times per day. POSTURE AND BODY MECHANICS CONSIDERATIONS - Cervical Strain and Sprain Keeping correct posture when sitting, standing or completing your activities will reduce the stress put on different body tissues, allowing injured tissues a chance to heal and limiting painful experiences. The following are general guidelines for improved posture. Your physician or physical therapist will provide you with any instructions specific to your needs. While reading these guidelines, remember:  The exercises prescribed by your provider will help you have the flexibility and strength to maintain correct postures.  The correct posture provides the optimal environment for your joints to   work. All of your joints have less wear and tear when properly supported by a spine with good posture. This means you will experience a healthier, less painful body.  Correct posture must be practiced with all of your activities, especially prolonged sitting and standing. Correct posture is as important when doing repetitive low-stress activities (typing) as it is when doing a single heavy-load activity (lifting). PROLONGED STANDING WHILE SLIGHTLY LEANING FORWARD When completing a task that requires you to lean  forward while standing in one place for a long time, place either foot up on a stationary 2- to 4-inch high object to help maintain the best posture. When both feet are on the ground, the low back tends to lose its slight inward curve. If this curve flattens (or becomes too large), then the back and your other joints will experience too much stress, fatigue more quickly, and can cause pain.  RESTING POSITIONS Consider which positions are most painful for you when choosing a resting position. If you have pain with flexion-based activities (sitting, bending, stooping, squatting), choose a position that allows you to rest in a less flexed posture. You would want to avoid curling into a fetal position on your side. If your pain worsens with extension-based activities (prolonged standing, working overhead), avoid resting in an extended position such as sleeping on your stomach. Most people will find more comfort when they rest with their spine in a more neutral position, neither too rounded nor too arched. Lying on a non-sagging bed on your side with a pillow between your knees, or on your back with a pillow under your knees will often provide some relief. Keep in mind, being in any one position for a prolonged period of time, no matter how correct your posture, can still lead to stiffness. WALKING Walk with an upright posture. Your ears, shoulders, and hips should all line up. OFFICE WORK When working at a desk, create an environment that supports good, upright posture. Without extra support, muscles fatigue and lead to excessive strain on joints and other tissues. CHAIR:  A chair should be able to slide under your desk when your back makes contact with the back of the chair. This allows you to work closely.  The chair's height should allow your eyes to be level with the upper part of your monitor and your hands to be slightly lower than your elbows.  Body position:  Your feet should make contact with the  floor. If this is not possible, use a foot rest.  Keep your ears over your shoulders. This will reduce stress on your neck and low back. Document Released: 07/08/2005 Document Revised: 11/22/2013 Document Reviewed: 10/20/2008 Exit[employer] Patient Information 2015 [employer]. This information is not intended to replace advice given to you by your health care provider. Make sure you discuss any questions you have with your health care provider.  
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
## 2015-03-22 ENCOUNTER — Other Ambulatory Visit: Payer: Self-pay | Admitting: Orthopedic Surgery

## 2015-03-22 DIAGNOSIS — M542 Cervicalgia: Secondary | ICD-10-CM

## 2015-03-26 ENCOUNTER — Ambulatory Visit
Admission: RE | Admit: 2015-03-26 | Discharge: 2015-03-26 | Disposition: A | Discharge status: Home / Self Care | Payer: 59 | Source: Ambulatory Visit | Attending: Orthopedic Surgery | Admitting: Orthopedic Surgery

## 2015-03-26 DIAGNOSIS — M542 Cervicalgia: Secondary | ICD-10-CM

## 2016-12-30 ENCOUNTER — Ambulatory Visit (HOSPITAL_COMMUNITY): Payer: 59 | Admitting: Psychiatry

## 2017-01-27 ENCOUNTER — Ambulatory Visit (HOSPITAL_COMMUNITY): Payer: 59 | Admitting: Psychiatry

## 2017-03-17 ENCOUNTER — Encounter (HOSPITAL_COMMUNITY): Payer: Self-pay | Admitting: Psychiatry

## 2017-03-17 ENCOUNTER — Encounter (INDEPENDENT_AMBULATORY_CARE_PROVIDER_SITE_OTHER): Payer: Self-pay

## 2017-03-17 ENCOUNTER — Ambulatory Visit (INDEPENDENT_AMBULATORY_CARE_PROVIDER_SITE_OTHER): Payer: 59 | Admitting: Psychiatry

## 2017-03-17 VITALS — BP 132/80 | HR 89 | Ht 63.0 in | Wt 308.0 lb

## 2017-03-17 DIAGNOSIS — Z818 Family history of other mental and behavioral disorders: Secondary | ICD-10-CM

## 2017-03-17 DIAGNOSIS — F321 Major depressive disorder, single episode, moderate: Secondary | ICD-10-CM

## 2017-03-17 DIAGNOSIS — G4700 Insomnia, unspecified: Secondary | ICD-10-CM

## 2017-03-17 DIAGNOSIS — Z566 Other physical and mental strain related to work: Secondary | ICD-10-CM | POA: Diagnosis not present

## 2017-03-17 MED ORDER — TRAZODONE HCL 50 MG PO TABS: 50.0000 mg | ORAL_TABLET | Freq: Every evening | ORAL | 0 refills | Status: DC | PRN

## 2017-03-17 MED ORDER — FLUOXETINE HCL 20 MG PO CAPS: 20.0000 mg | ORAL_CAPSULE | Freq: Every day | ORAL | 1 refills | Status: DC

## 2017-03-17 NOTE — Progress Notes (Signed)
Psychiatric Initial Adult Assessment   Patient Identification: Jeanette Santos MRN:  [id number] Date of Evaluation:  03/17/2017 Referral Source: self Chief Complaint:  anxiety, depression Visit Diagnosis:    ICD-10-CM   1. Current moderate episode of major depressive disorder without prior episode (HCC) F32.1     History of Present Illness:  Jeanette Santos is a 36 year old female with no prior psychiatric treatment or psychiatric history who presents today for intake assessment in the context of significant work stressors. 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 She reports that she has worked at [employer] for almost 14 years, and she has always loved her job until these recent changes.   She reports that she has noticed she feels burned out and exhausted when she goes home in the evening. 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 She worries that she would lose her job, and becomes anxious about financial concerns. She reports that it's hard to get sleep at night due to rumination about today's events. She does not feel energetic and active as she normally is, and has not felt motivated to go out and do activities with her fianc which she normally does like to do. She denies any suicidal thoughts or any substance abuse.  She reports that she had worked with an EAP therapist through the company a few years back when she was having some anxiety and life stressors, and this was very helpful. She reports that she feels she needs to be on a medicine to help with her depression, and she has talked with some of her friends about this. I spent time educating the patient about SSRI in the pathophysiology of depression.  She denies any symptoms of mania or psychosis, and denies any auditory or visual hallucinations. She is an only child, and there is a family history of depression in  her maternal grandmother, but no history of bipolar disorder or schizoaffective disorder, depression, OCD, panic. I spent time educating her about Prozac, and we reviewed the risks and benefits, and agreed to start at 20 mg daily. We also reviewed the risks and benefits of trazodone to help with immediate symptoms of insomnia. We also agreed to follow up in 8 weeks, and she will work on establishing EAP follow-up through her company.  Associated Signs/Symptoms: Depression Symptoms:  depressed mood, anhedonia, insomnia, fatigue, feelings of worthlessness/guilt, difficulty concentrating, impaired memory, anxiety, (Hypo) Manic Symptoms:  none Anxiety Symptoms:  Excessive Worry, Psychotic Symptoms:  none PTSD Symptoms: Negative  Past Psychiatric History: No prior psychiatric treatment  Previous Psychotropic Medications: No   Substance Abuse History in the last 12 months:  No.  Consequences of Substance Abuse: Negative  Past Medical History:  Past Medical History:  Diagnosis Date  . Anemia   . Arnold-Chiari malformation (HCC) 02/11/2014  . Hypertension   . Obesity     Past Surgical History:  Procedure Laterality Date  . NONE      Family Psychiatric History: Family history of depression and her maternal grandmother  Family History:  Family History  Problem Relation Age of Onset  . Hypertension Mother     Social History:   Social History   Social History  . Marital status: Single    Spouse name: N/A  . Number of children: 0  . Years of education: COLLEGE   Occupational History  .  At [employer]   Social History Main Topics  . Smoking status: Never Smoker  . Smokeless tobacco: Never Used  . Alcohol use Yes     Comment: social   . Drug use: No  . Sexual activity: Yes    Birth control/ protection: None   Other Topics Concern  . None   Social History Narrative  . 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 Vicodin [Hydrocodone-Acetaminophen] Itching    Metabolic Disorder Labs: No results found for: HGBA1C, MPG No results found for: PROLACTIN Lab Results  Component Value Date   CHOL 139 03/26/2013   TRIG 139 03/26/2013   HDL 40 03/26/2013   CHOLHDL 3.5 03/26/2013   VLDL 28 03/26/2013   LDLCALC 71 03/26/2013   LDLCALC 87 10/13/2008     Current Medications: Current Outpatient Prescriptions  Medication Sig Dispense Refill  . Biotin w/ Vitamins C & E (HAIR SKIN & NAILS GUMMIES PO) Take 2 tablets by mouth daily.    . hydrochlorothiazide (HYDRODIURIL) 12.5 MG tablet Take 12.5 mg by mouth daily.    Marland Kitchen ibuprofen (ADVIL,MOTRIN) 200 MG tablet Take 200 mg by mouth every 6 (six) hours as needed (For pain.).    Marland Kitchen Multiple Vitamins-Minerals (ADULT GUMMY PO) Take 2 tablets by mouth daily.    Marland Kitchen FLUoxetine (PROZAC) 20 MG capsule Take 1 capsule (20 mg total) by mouth daily. 90 capsule 1  . traZODone (DESYREL) 50 MG tablet Take 1 tablet (50 mg total) by mouth at bedtime as needed for sleep. 90 tablet 0   No current facility-administered medications for this visit.     Neurologic: Headache: Negative Seizure: Negative Paresthesias:Negative  Musculoskeletal: Strength & Muscle Tone: within normal limits Gait & Station: normal Patient leans: N/A  Psychiatric Specialty Exam: Review of Systems  Constitutional: Negative.   HENT: Negative.   Respiratory: Negative.   Cardiovascular: Negative.   Gastrointestinal: Negative.   Musculoskeletal: Negative.   Neurological: Negative.   Psychiatric/Behavioral: Positive for depression. The patient is nervous/anxious and has insomnia.     Blood pressure 132/80, pulse 89, height 5\' 3"  (1.6 m), weight (!) 308 lb (139.7 kg).Body mass index is 54.56 kg/m.  General Appearance: Casual and Fairly Groomed  Eye Contact:  Good  Speech:  Clear and Coherent  Volume:  Normal  Mood:   Anxious and Dysphoric  Affect:  Congruent  Thought Process:  Goal Directed  Orientation:  Full (Time, Place, and Person)  Thought Content:  Logical  Suicidal Thoughts:  No  Homicidal Thoughts:  No  Memory:  Immediate;   Good  Judgement:  Good  Insight:  Good  Psychomotor Activity:  Normal  Concentration:  Concentration: Good  Recall:  Good  Fund of Knowledge:Good  Language: Good  Akathisia:  Negative  Handed:  Right  AIMS (if indicated):  0  Assets:  Communication Skills Desire for Improvement Financial Resources/Insurance Housing Intimacy Leisure Time Physical Health Resilience Social Support Talents/Skills Transportation Vocational/Educational  ADL's:  Intact  Cognition: WNL  Sleep:  5-6 hours, restless sleep    Treatment Plan Summary: Jeanette Santos is a 36 year old female with no prior psychiatric history who presents today with an episode of major depressive disorder in the context of work stressors, and feeling generally burned out with some of her changed employment duties. Her symptoms have persisted for the past 6 months to year and  worsened despite efforts on her behalf to take vacation, engage in restorative activities, exercise, coping strategies. She is a good candidate for SSRI, and is medication nave. She is of childbearing age and her and her fianc are in the process of family planning. I educated her on the safety and risks and benefits of Prozac in pregnancy, and recommended she continue SSRI if she was to become pregnant, as maternal depression presents a greater risk to the fetus than SSRI would.  She does not have any acute safety issues and agrees to follow-up in 8 weeks.    1. Current moderate episode of major depressive disorder without prior episode (HCC)     Initiate Prozac 20 mg daily Trazodone 50 mg nightly as needed for sleep Follow-up in 8 weeks Encouraged EAP for counseling through [employer]  Burnard Leigh, MD 8/27/20183:45 PM

## 2017-05-12 ENCOUNTER — Ambulatory Visit (INDEPENDENT_AMBULATORY_CARE_PROVIDER_SITE_OTHER): Payer: 59 | Admitting: Psychiatry

## 2017-05-12 ENCOUNTER — Encounter (HOSPITAL_COMMUNITY): Payer: Self-pay | Admitting: Psychiatry

## 2017-05-12 VITALS — BP 144/78 | HR 89 | Ht 63.0 in | Wt 313.8 lb

## 2017-05-12 DIAGNOSIS — F419 Anxiety disorder, unspecified: Secondary | ICD-10-CM | POA: Diagnosis not present

## 2017-05-12 DIAGNOSIS — Z566 Other physical and mental strain related to work: Secondary | ICD-10-CM

## 2017-05-12 DIAGNOSIS — F321 Major depressive disorder, single episode, moderate: Secondary | ICD-10-CM

## 2017-05-12 DIAGNOSIS — F5104 Psychophysiologic insomnia: Secondary | ICD-10-CM | POA: Diagnosis not present

## 2017-05-12 DIAGNOSIS — Z6379 Other stressful life events affecting family and household: Secondary | ICD-10-CM

## 2017-05-12 MED ORDER — FLUOXETINE HCL 20 MG PO CAPS: 20.0000 mg | ORAL_CAPSULE | Freq: Every day | ORAL | 1 refills | Status: DC

## 2017-05-12 MED ORDER — TRAZODONE HCL 100 MG PO TABS: 100.0000 mg | ORAL_TABLET | Freq: Every evening | ORAL | 0 refills | Status: DC | PRN
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
## 2017-05-28 ENCOUNTER — Telehealth (HOSPITAL_COMMUNITY): Payer: Self-pay

## 2017-05-28 ENCOUNTER — Telehealth (HOSPITAL_COMMUNITY): Payer: Self-pay | Admitting: Psychiatry

## 2017-05-28 NOTE — Telephone Encounter (Signed)
Patient called stating that she is feeling crappy, wants to know if there is any options for her to have a leave from work because she has a lot on her plate. Last visit was on 05/12/17, next scheduled visit is on 07/04/17. Please review and advise.

## 2017-05-28 NOTE — Telephone Encounter (Signed)
Yes, id like her to participate in the IOP program so that she can get the support she needs and have daily medication management and therapy.  They will work with her to take care of paperwork to be out of work, and I will support that process.   I am cc-ing [place] SinkRita to reach out to her this week.

## 2017-05-28 NOTE — Telephone Encounter (Signed)
[place] SinkRita stated she would call the patient back.

## 2017-06-09 ENCOUNTER — Ambulatory Visit (HOSPITAL_COMMUNITY): Payer: Self-pay | Admitting: Licensed Clinical Social Worker

## 2017-07-01 ENCOUNTER — Ambulatory Visit (HOSPITAL_COMMUNITY): Payer: Self-pay | Admitting: Licensed Clinical Social Worker

## 2017-07-04 ENCOUNTER — Encounter (HOSPITAL_COMMUNITY): Payer: Self-pay | Admitting: Psychiatry

## 2017-07-04 ENCOUNTER — Ambulatory Visit (INDEPENDENT_AMBULATORY_CARE_PROVIDER_SITE_OTHER): Payer: 59 | Admitting: Psychiatry

## 2017-07-04 VITALS — BP 130/78 | HR 88 | Ht 63.5 in | Wt 313.0 lb

## 2017-07-04 DIAGNOSIS — F5104 Psychophysiologic insomnia: Secondary | ICD-10-CM | POA: Diagnosis not present

## 2017-07-04 DIAGNOSIS — Z566 Other physical and mental strain related to work: Secondary | ICD-10-CM

## 2017-07-04 DIAGNOSIS — F321 Major depressive disorder, single episode, moderate: Secondary | ICD-10-CM

## 2017-07-04 MED ORDER — CLONAZEPAM 0.5 MG PO TABS: 0.5000 mg | ORAL_TABLET | Freq: Two times a day (BID) | ORAL | 1 refills | Status: DC

## 2017-07-04 MED ORDER — CITALOPRAM HYDROBROMIDE 20 MG PO TABS: 20.0000 mg | ORAL_TABLET | Freq: Every day | ORAL | 1 refills | Status: DC

## 2017-07-04 MED ORDER — TRAZODONE HCL 100 MG PO TABS: 100.0000 mg | ORAL_TABLET | Freq: Every evening | ORAL | 0 refills | Status: DC | PRN
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
## 2017-07-04 NOTE — Patient Instructions (Signed)
STOP prozac  START Celexa this morning  START Clonazepam this morning

## 2017-07-24 ENCOUNTER — Encounter (HOSPITAL_COMMUNITY): Payer: Self-pay | Admitting: Licensed Clinical Social Worker

## 2017-07-24 ENCOUNTER — Ambulatory Visit (INDEPENDENT_AMBULATORY_CARE_PROVIDER_SITE_OTHER): Payer: PRIVATE HEALTH INSURANCE | Admitting: Licensed Clinical Social Worker

## 2017-07-24 DIAGNOSIS — F321 Major depressive disorder, single episode, moderate: Secondary | ICD-10-CM
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
## 2017-08-11 ENCOUNTER — Encounter (HOSPITAL_COMMUNITY): Payer: Self-pay | Admitting: Psychiatry

## 2017-08-11 ENCOUNTER — Other Ambulatory Visit (HOSPITAL_COMMUNITY): Payer: PRIVATE HEALTH INSURANCE | Attending: Psychiatry | Admitting: Psychiatry

## 2017-08-11 DIAGNOSIS — F419 Anxiety disorder, unspecified: Secondary | ICD-10-CM | POA: Insufficient documentation

## 2017-08-11 DIAGNOSIS — F439 Reaction to severe stress, unspecified: Secondary | ICD-10-CM | POA: Diagnosis present

## 2017-08-11 DIAGNOSIS — F331 Major depressive disorder, recurrent, moderate: Secondary | ICD-10-CM | POA: Diagnosis not present

## 2017-08-11 DIAGNOSIS — G4700 Insomnia, unspecified: Secondary | ICD-10-CM | POA: Insufficient documentation

## 2017-08-11 DIAGNOSIS — Z79899 Other long term (current) drug therapy: Secondary | ICD-10-CM | POA: Insufficient documentation

## 2017-08-11 DIAGNOSIS — Z818 Family history of other mental and behavioral disorders: Secondary | ICD-10-CM | POA: Diagnosis not present

## 2017-08-11 DIAGNOSIS — D649 Anemia, unspecified: Secondary | ICD-10-CM | POA: Diagnosis not present
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
## 2017-08-12 ENCOUNTER — Other Ambulatory Visit (HOSPITAL_COMMUNITY): Payer: PRIVATE HEALTH INSURANCE | Admitting: Psychiatry

## 2017-08-12 DIAGNOSIS — F331 Major depressive disorder, recurrent, moderate: Secondary | ICD-10-CM
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
## 2017-08-13 ENCOUNTER — Other Ambulatory Visit (HOSPITAL_COMMUNITY): Payer: PRIVATE HEALTH INSURANCE

## 2017-08-14 ENCOUNTER — Other Ambulatory Visit (HOSPITAL_COMMUNITY): Payer: PRIVATE HEALTH INSURANCE | Admitting: Psychiatry

## 2017-08-14 DIAGNOSIS — F331 Major depressive disorder, recurrent, moderate: Secondary | ICD-10-CM

## 2017-08-15 ENCOUNTER — Other Ambulatory Visit (HOSPITAL_COMMUNITY): Payer: PRIVATE HEALTH INSURANCE | Admitting: Psychiatry

## 2017-08-15 DIAGNOSIS — F331 Major depressive disorder, recurrent, moderate: Secondary | ICD-10-CM | POA: Diagnosis not present

## 2017-08-15 NOTE — Progress Notes (Signed)
    Daily Group Progress Note  Program: IOP  Group Time: 9:00-12:00 Participation Level: Active Behavioral Response: Appropriate, Sharing Type of Therapy:  Group Summary of Progress: P Group session was focused on themes involving stressors and dissatisfaction with social and work related areas in life. Session also address changes that may need to occur as well as ideas on how to achieve them. Lastly, the topic of self-care was brought up in order to further assess wellness within the lives of the patients. Patient feels grateful for being alive and present in the moment. 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 States that work drains her livelihood. Patient also relates to the experiences of other member's experiences from working in customer service. In regards to self-care, patient is open with the idea of continuing to listen to music that makes her feel pleasant.  Shaune PollackBrown, Stefannie Defeo B, LPC

## 2017-08-15 NOTE — Progress Notes (Signed)
    Daily Group Progress Note  Program: IOP  Group Time: 9:00-12:00 Participation Level: Active Behavioral Response: Appropriate and sharing Type of Therapy:  Group Summary of Progress: The theme for group today was bringing awareness of our thoughts and feelings by sharing one thing that you can do today that would make you happy.  It could be a place, a situation, a song, a movie, or a book. The Counselor facilitated the session with the "I Am," poem as a psychoeducational activity to engage everyone in the discussion and raise awareness.  The case manager facilitated the second part of group with the Discharge Planning Workbook.  She discussed the procedures for discharging members, informed members of who their treatment team members are, when they meet and went over the workbook to address the different topics that are covered.  Patient expressed she is feeling fine today.  She had a wonderful day in [place] at [employer] for her birthday yesterday.  She really enjoyed herself.  One thing she wants to do is learn how to say no and be happy with it. Patient participated in discussion and was supportive to other group members.   Shaune PollackBrown, Jeanette Santos, LPC

## 2017-08-18 ENCOUNTER — Other Ambulatory Visit (HOSPITAL_COMMUNITY): Payer: PRIVATE HEALTH INSURANCE | Admitting: Psychiatry

## 2017-08-18 DIAGNOSIS — F331 Major depressive disorder, recurrent, moderate: Secondary | ICD-10-CM | POA: Diagnosis not present
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
## 2017-08-19 ENCOUNTER — Other Ambulatory Visit (HOSPITAL_COMMUNITY): Payer: PRIVATE HEALTH INSURANCE | Admitting: Psychiatry

## 2017-08-19 DIAGNOSIS — F331 Major depressive disorder, recurrent, moderate: Secondary | ICD-10-CM

## 2017-08-20 ENCOUNTER — Other Ambulatory Visit (HOSPITAL_COMMUNITY): Payer: PRIVATE HEALTH INSURANCE | Admitting: Psychiatry

## 2017-08-20 DIAGNOSIS — F331 Major depressive disorder, recurrent, moderate: Secondary | ICD-10-CM | POA: Diagnosis not present

## 2017-08-21 ENCOUNTER — Other Ambulatory Visit (HOSPITAL_COMMUNITY): Payer: PRIVATE HEALTH INSURANCE | Admitting: Psychiatry

## 2017-08-21 DIAGNOSIS — F331 Major depressive disorder, recurrent, moderate: Secondary | ICD-10-CM | POA: Diagnosis not present
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
## 2017-08-21 NOTE — Progress Notes (Signed)
    Daily Group Progress Note  Program: IOP  Group Time: 9:00-12:00 Participation Level: Active Behavioral Response: Appropriate and sharing Type of Therapy:  Group Summary of Progress: The focus for today's group session was It's okay to have low energy for a day while replenishing and don't be afraid to ask for help.  The therapist facilitated the session with different recommendation of ways to get help and resources that can be helpful in assisting each member with their particular need or issue that they are struggling with. The patient expressed in group that she was tired and frustrated with her stressful job issue. She is looking for another job and planning to attend the [employer] on Thursday. Patient was active in group and shared some insights with group member.   Shaune PollackBrown, Jennifer B, LPC

## 2017-08-21 NOTE — Progress Notes (Deleted)
    Daily Group Progress Note  Program: IOP  Group Time: 9:00-12:00 Participation Level: Active Behavioral Response: Appropriate and sharing Type of Therapy:  Group Summary of Progress: The focus for today's group session was, "It's okay to have low energy for a day while replenishing and don't be afraid to ask for help."  The therapist facilitated the session with different recommendation of ways to get help and resources that can be helpful in assisting each member with their particular need or issue that they are struggling with. The patient expressed in group that she was tired and frustrated with her stressful job issue. She is looking for another job and planning to attend the [employer] on Thursday. Patient was active in group and shared some insights with group member.   Shaune PollackBrown, Dyson Sevey B, LPC

## 2017-08-21 NOTE — Progress Notes (Signed)
    Daily Group Progress Note  Program: IOP  Group Time: 9:00-12:00 Participation Level: Active Behavioral Response: Appropriate, Sharing,  Type of Therapy:  Group Summary of Progress: Group session was focused on themes thoughts involving potential career changes and work related adjustments and potential options for eliminating life stressors. Members also shared thoughts and empathized with one another about making certain changes in their lives in order to increase overall life satisfaction. Patient expresses not seeing herself staying at her current position given the fact that it seems to take away her livelihood, which is a important trait to her. Patient stated that she used to love her job; however, her satisfaction went down hill ever since they upgraded their Educational psychologistsale tactics. Patient also gave thoughtful feedback and understanding towards other members of the group. Pt. participated in medication management group with the pharmacist.  Shaune PollackBrown, Jennifer B, [facility]

## 2017-08-22 ENCOUNTER — Other Ambulatory Visit (HOSPITAL_COMMUNITY): Payer: PRIVATE HEALTH INSURANCE | Attending: Psychiatry | Admitting: Psychiatry

## 2017-08-22 DIAGNOSIS — F331 Major depressive disorder, recurrent, moderate: Secondary | ICD-10-CM | POA: Diagnosis not present

## 2017-08-22 DIAGNOSIS — F419 Anxiety disorder, unspecified: Secondary | ICD-10-CM | POA: Diagnosis not present

## 2017-08-22 DIAGNOSIS — Z79899 Other long term (current) drug therapy: Secondary | ICD-10-CM | POA: Insufficient documentation

## 2017-08-22 NOTE — Progress Notes (Signed)
    Daily Group Progress Note  Program: IOP Group Time: 9:00-12:00 Participation Level: Active Behavioral Response: Appropriate and sharing Type of Therapy:  Group Summary of Progress: The theme for the first part of group today focused on Cognitive Modeling to think more positive.  The question was, "What is the discomfort (s) that's causing the anxiety?"   The therapist helped each group member identify the discomfort that's causing their anxiety and learning how to sit with it.  The second part of group was facilitated by the chaplain on the topic, "What are the losses in your life connected to and how did your family grieve?"The patient stated that she was fine.  The stress from her job is what she needs to work on and manage her anxiety of starting over at another job. During grief counseling, the patient indicated that she feels like she's losing herself.  She needs to leave the job in order to find herself again. The patient was active and share some thoughts with group members.  Her behavioral response was appropriate.   Shaune PollackBrown, Paddy Walthall B, LPC

## 2017-08-25 ENCOUNTER — Other Ambulatory Visit (HOSPITAL_COMMUNITY): Payer: PRIVATE HEALTH INSURANCE | Admitting: Psychiatry

## 2017-08-25 DIAGNOSIS — F331 Major depressive disorder, recurrent, moderate: Secondary | ICD-10-CM | POA: Diagnosis not present

## 2017-08-25 NOTE — Progress Notes (Signed)
    Daily Group Progress Note  Program: IOP  Group Time: 9:00-12:00  Participation Level: Active  Behavioral Response: Appropriate  Type of Therapy:  Group Therapy  Summary of Progress: Pt. Presented as calm, talkative, engaged in the group process. Pt. Participated in discussion about the importance of general wellness behaviors I.e., nutrition, sleep, and movement to mental health. Pt. Discussed her job dissatisfaction and fears about her depression worsening on her return to work. Pt. Received feedback from the therapist and other group members about creating a transition plan to return to work.      Shaune PollackBrown, Jennifer B, LPC

## 2017-08-26 ENCOUNTER — Other Ambulatory Visit (HOSPITAL_COMMUNITY): Payer: PRIVATE HEALTH INSURANCE | Admitting: Psychiatry

## 2017-08-26 DIAGNOSIS — F331 Major depressive disorder, recurrent, moderate: Secondary | ICD-10-CM

## 2017-08-26 NOTE — Progress Notes (Signed)
    Daily Group Progress Note  Program: IOP  Group Time: 9:00-12:00  Participation Level: Active  Behavioral Response: Appropriate  Type of Therapy:  Group Therapy   Summary of Progress: The first part of group was the pharmacist consultation with patients to educate the patient on the side effects of medications and address different issues relating to various medications that patients are experiencing.  The second part of group's theme related to Loss of identity that depression can cause while grieving and what balance might look like for you in your life? The therapist assigned a question for homework for the patient to think about and discuss tomorrow. "What balance might look like in your life?The patient indicated that she was fine.  She wants to let go of her job and all the stress. The patient active and supportive of group members.  Her behavioral response was appropriate.   Shaune PollackBrown, Jennifer B, LPC

## 2017-08-27 ENCOUNTER — Other Ambulatory Visit (HOSPITAL_COMMUNITY): Payer: PRIVATE HEALTH INSURANCE | Admitting: Psychiatry

## 2017-08-27 DIAGNOSIS — F331 Major depressive disorder, recurrent, moderate: Secondary | ICD-10-CM

## 2017-08-28 ENCOUNTER — Other Ambulatory Visit (HOSPITAL_COMMUNITY): Payer: PRIVATE HEALTH INSURANCE | Admitting: Psychiatry

## 2017-08-28 DIAGNOSIS — F331 Major depressive disorder, recurrent, moderate: Secondary | ICD-10-CM | POA: Diagnosis not present

## 2017-08-28 NOTE — Progress Notes (Signed)
    Daily Group Progress Note  Program: IOP  Group Time: 9:00-12:00  Participation Level: Active  Behavioral Response: Appropriate  Type of Therapy:  Group Therapy  Summary of Progress: Group session was focused on themes involving the members sharing interesting facts about themselves during an icebreaker. Session also involved relating to one another and concepts on how to live a more balanced life while centering the self. Second half of the session involved talking about topics related to grief. Patient expressed unique traits about herself. In regards to being balanced, she would like to have her own office space, be able to help people and travel just for herself. Patient mentioned feeling like she lost her place in life after her job went downhill. Also expressed trying to remain positive despite all external obstacles.     Shaune PollackBrown, Jennifer B, LPC

## 2017-08-29 ENCOUNTER — Other Ambulatory Visit (HOSPITAL_COMMUNITY): Payer: PRIVATE HEALTH INSURANCE | Admitting: Psychiatry

## 2017-08-29 DIAGNOSIS — F331 Major depressive disorder, recurrent, moderate: Secondary | ICD-10-CM | POA: Diagnosis not present

## 2017-09-01 ENCOUNTER — Other Ambulatory Visit (HOSPITAL_COMMUNITY): Payer: PRIVATE HEALTH INSURANCE | Admitting: Psychiatry

## 2017-09-01 DIAGNOSIS — F331 Major depressive disorder, recurrent, moderate: Secondary | ICD-10-CM
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
## 2017-09-01 NOTE — Progress Notes (Signed)
    Daily Group Progress Note  Program: IOP  Group Time: 9:00-12:00  Participation Level: Active  Behavioral Response: Appropriate  Type of Therapy:  Group Therapy  Summary of Progress: The theme for group today was a continuation relating to "What balance looks like for you in your life?" The therapist facilitated with the idea of how "Making a tiny shift daily can make a massive change," relating back to vision and balance. The patient expressed she was fine.  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 The patient is very active and supportive in group.  Her behavioral response was appropriate.     Shaune PollackBrown, Jennifer B, LPC

## 2017-09-01 NOTE — Patient Instructions (Signed)
D:  Patient successfully completed MH-IOP today.  A:  Discharge today.  Follow up with Dorann LodgeWes Swan, LPC on 09-18-17 @ 9 a.m and Dr. Rene KocherEksir on 09-25-17 @ 8 a.m.  Encouraged support groups.  Highly recommended [facility].  R:  Pt receptive.

## 2017-09-01 NOTE — Progress Notes (Signed)
    Daily Group Progress Note  Program: IOP  Group Time: 9:00-12:00  Participation Level: Active  Behavioral Response: Appropriate  Type of Therapy:  Group Therapy  Summary of Progress: The case manager asked the group members to pick a word to describe how they felt.  The therapist led a discussion about practicing positive thoughts and letting go of things that don't serve us to uncover the core issues.  The therapist facilitated the sessions by listening to each member describing their thoughts and feelings.  She encouraged members to share more details through questions and insights for clarifications. The second part of group entailed a Mental Health Wellness Information Session by the Director of [employer].  The session was to inform the group of the Mental Health [facility] support groups that are available for free to assist and encourage group members with managing symptoms, cope with stress and other key recovery concepts.  The patient was actively listening and observing as other group members were sharing their thoughts.  The patient participated in the yoga and exercise for breathing and meditation. The patient was active and supportive of group members.  Her behavioral response was appropriate.      Shaune PollackBrown, Jennifer B, LPC

## 2017-09-01 NOTE — Progress Notes (Signed)
  [facility] Discharge Summary  Jeanette Santos [id number]  Admission date: August 11, 2016  Discharge date: September 01, 2017  Reason for admission: [facility] severe depression, having crying spells, feeling hopeless less and anhedonia.  Please see admission note.  Chemical Use History: She denies any history of drug use.  Family of Origin Issues: Reviewed.  Progress in Program Toward Treatment Goals: Participated in the groups.  She verbalized her feeling.  She did complete the group and felt much better.  Though she is somewhat anxious going back to work but her coping skills are much better.  She is taking Celexa 20 mg daily.  Sometimes she takes trazodone at bedtime for insomnia.  She has not taken Klonopin in a while.  She is scheduled to go back to work on February 21.  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  She is been working there for 12 years.  Patient denies any suicidal thoughts, paranoia, hallucination or any feeling of hopelessness or worthlessness.  She still have residual anxiety but overall she feels much better with the group.  Progress (rationale): Improved and patient able to attain her goals.  Patient has appointment to see Dr. Suann Larryesker few weeks.  She also continue her individual therapy with [facility] in this office. Continue Celexa 20 mg daily, trazodone 100 mg as needed for insomnia.    [facility] 09/01/2017

## 2017-09-01 NOTE — Progress Notes (Signed)
    Daily Group Progress Note  Program: IOP  Group Time: 9:00-12:00  Participation Level: Active  Behavioral Response: Appropriate  Type of Therapy:  Group Therapy  Summary of Progress: The theme for group today was a continuation of yesterday's session of "Practice positive thoughts and letting go of things that don't serve us as we uncover the core issues."  The therapist facilitated the session by allowing the group members who didn't have the opportunity to share their thoughts yesterday on how the topic resonated with them as they reflected on what other members shared previously.  The group discussed the importance of setting boundaries and sitting with the discomfort of setting boundaries.  Information about general wellness, breathing and meditation exercise for relaxation and mindfulness was given. The patient expressed she was fine.  She shared with group that she had to set her boundaries in order to protect herself from family members who are also trying to take advantage by asking her for financial help constantly.  It's difficult, but she's learning to be comfortable with saying No and feel fine. The patient was active and supportive of group members.  Her behavioral response is appropriate.  Shaune PollackBrown, Jennifer B, LPC

## 2017-09-02 ENCOUNTER — Other Ambulatory Visit (HOSPITAL_COMMUNITY): Payer: PRIVATE HEALTH INSURANCE

## 2017-09-02 NOTE — Progress Notes (Signed)
    Daily Group Progress Note  Program: IOP  Group Time: 9:00-12:00  Participation Level: Active  Behavioral Response: Appropriate  Type of Therapy:  Group Therapy  Summary of Progress: The group met with the pharmacist regarding medications and side effects.  The pharmacist facilitated the session by sharing information about the various types of drugs, side effects and effectiveness.  She addressed the questions and concerns of the group members.  She also explained the importance of managing coping skills with medications for effective outcomes.  The theme for the second part of group was about "Managing conflicts and working out a plan for future goals."  The therapist helped the group members understand the importance of expressing themselves without feeling guilty or blaming themselves. The patient shared with group that she was fine.  Her experience in group was good, but she's not sure of what to expect when she goes back to work.  She will continue to send out resumes and look for another job.  She was discharged today and she will stay in touch with members of the group. The patient was active in group and very supportive of group members.  Her behavioral response was appropriate.    Nancie Neas, LPC

## 2017-09-03 ENCOUNTER — Other Ambulatory Visit (HOSPITAL_COMMUNITY): Payer: PRIVATE HEALTH INSURANCE

## 2017-09-04 ENCOUNTER — Other Ambulatory Visit (HOSPITAL_COMMUNITY): Payer: PRIVATE HEALTH INSURANCE

## 2017-09-05 ENCOUNTER — Other Ambulatory Visit (HOSPITAL_COMMUNITY): Payer: PRIVATE HEALTH INSURANCE

## 2017-09-08 ENCOUNTER — Other Ambulatory Visit (HOSPITAL_COMMUNITY): Payer: PRIVATE HEALTH INSURANCE

## 2017-09-09 ENCOUNTER — Other Ambulatory Visit (HOSPITAL_COMMUNITY): Payer: PRIVATE HEALTH INSURANCE

## 2017-09-10 ENCOUNTER — Telehealth (HOSPITAL_COMMUNITY): Payer: Self-pay | Admitting: Psychiatry

## 2017-09-10 ENCOUNTER — Other Ambulatory Visit (HOSPITAL_COMMUNITY): Payer: PRIVATE HEALTH INSURANCE

## 2017-09-10 NOTE — Telephone Encounter (Signed)
D:  Pt just sent writer an email stating that she went to [employer] to pick up some items before her return on 09-12-17; states she had a panic attack in the parking lot and doesn't know if she can return now.  Reports she is having a hard time returning now and wanted to know if there are any other options for her.  A:  Will inform Drs. Ladona Ridgelaylor and Hormel FoodsEksir.  Pt just completed ~ three weeks of MH-IOP last week.  The recommendation whenever she left group was for her to f/u with Dorann [facility]Wes Swan, [facility] on 09-12-17 and Dr. Rene KocherEksir on 09-25-17.  Strongly had recommended [facility].  Jeri Modenaita Clark, M.Ed, CNA

## 2017-09-11 ENCOUNTER — Other Ambulatory Visit (HOSPITAL_COMMUNITY): Payer: PRIVATE HEALTH INSURANCE

## 2017-09-12 ENCOUNTER — Other Ambulatory Visit (HOSPITAL_COMMUNITY): Payer: PRIVATE HEALTH INSURANCE

## 2017-09-15 ENCOUNTER — Other Ambulatory Visit (HOSPITAL_COMMUNITY): Payer: PRIVATE HEALTH INSURANCE

## 2017-09-16 ENCOUNTER — Other Ambulatory Visit (HOSPITAL_COMMUNITY): Payer: PRIVATE HEALTH INSURANCE

## 2017-09-17 ENCOUNTER — Other Ambulatory Visit (HOSPITAL_COMMUNITY): Payer: PRIVATE HEALTH INSURANCE

## 2017-09-18 ENCOUNTER — Ambulatory Visit (HOSPITAL_COMMUNITY): Payer: Self-pay | Admitting: Licensed Clinical Social Worker

## 2017-09-18 ENCOUNTER — Other Ambulatory Visit (HOSPITAL_COMMUNITY): Payer: PRIVATE HEALTH INSURANCE

## 2017-09-19 ENCOUNTER — Other Ambulatory Visit (HOSPITAL_COMMUNITY): Payer: 59

## 2017-09-22 ENCOUNTER — Other Ambulatory Visit (HOSPITAL_COMMUNITY): Payer: 59

## 2017-09-23 ENCOUNTER — Other Ambulatory Visit (HOSPITAL_COMMUNITY): Payer: 59

## 2017-09-24 ENCOUNTER — Other Ambulatory Visit (HOSPITAL_COMMUNITY): Payer: 59

## 2017-09-25 ENCOUNTER — Ambulatory Visit (HOSPITAL_COMMUNITY): Payer: Self-pay | Admitting: Psychiatry

## 2017-09-25 ENCOUNTER — Other Ambulatory Visit (HOSPITAL_COMMUNITY): Payer: 59

## 2017-09-26 ENCOUNTER — Other Ambulatory Visit (HOSPITAL_COMMUNITY): Payer: 59

## 2017-09-29 ENCOUNTER — Other Ambulatory Visit (HOSPITAL_COMMUNITY): Payer: 59

## 2017-09-30 ENCOUNTER — Other Ambulatory Visit (HOSPITAL_COMMUNITY): Payer: 59

## 2017-10-01 ENCOUNTER — Other Ambulatory Visit (HOSPITAL_COMMUNITY): Payer: 59

## 2017-10-02 ENCOUNTER — Other Ambulatory Visit (HOSPITAL_COMMUNITY): Payer: 59

## 2017-10-03 ENCOUNTER — Other Ambulatory Visit (HOSPITAL_COMMUNITY): Payer: 59

## 2017-10-13 ENCOUNTER — Telehealth (HOSPITAL_COMMUNITY): Payer: Self-pay | Admitting: Psychiatry

## 2017-11-18 ENCOUNTER — Ambulatory Visit (INDEPENDENT_AMBULATORY_CARE_PROVIDER_SITE_OTHER): Payer: PRIVATE HEALTH INSURANCE | Admitting: Psychiatry

## 2017-11-18 ENCOUNTER — Encounter (HOSPITAL_COMMUNITY): Payer: Self-pay | Admitting: Psychiatry

## 2017-11-18 DIAGNOSIS — F321 Major depressive disorder, single episode, moderate: Secondary | ICD-10-CM | POA: Diagnosis not present

## 2017-11-18 DIAGNOSIS — Z79899 Other long term (current) drug therapy: Secondary | ICD-10-CM

## 2017-11-18 DIAGNOSIS — Z818 Family history of other mental and behavioral disorders: Secondary | ICD-10-CM

## 2017-11-18 DIAGNOSIS — F5104 Psychophysiologic insomnia: Secondary | ICD-10-CM

## 2017-11-18 DIAGNOSIS — Z566 Other physical and mental strain related to work: Secondary | ICD-10-CM | POA: Diagnosis not present

## 2017-11-18 MED ORDER — TRAZODONE HCL 100 MG PO TABS: 100.0000 mg | ORAL_TABLET | Freq: Every evening | ORAL | 0 refills | Status: DC | PRN

## 2017-11-18 MED ORDER — CITALOPRAM HYDROBROMIDE 20 MG PO TABS: 20.0000 mg | ORAL_TABLET | Freq: Every day | ORAL | 1 refills | Status: DC

## 2017-11-18 NOTE — Progress Notes (Signed)
BH MD/PA/NP OP Progress Note  11/18/2017 12:23 PM Jeanette Santos  MRN:  [id number]  Chief Complaint: Doing really well HPI: Jeanette Santos reports that she is doing really well, and rarely uses the clonazepam or trazodone.  Feels that the Celexa 20 mg daily in combination with her having a new manager at work has positively affected her mood.  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  She denies any suicidal thoughts, and reports that she has been generally feeling positive and hopeful.  She has received good feedback from her fianc that she seems like herself once again.  We agreed to continue Celexa 20 mg daily and trazodone as needed in the evening.  She has clonazepam available for emergency panic attack use.  Visit Diagnosis:    ICD-10-CM   1. Current moderate episode of major depressive disorder without prior episode (HCC) F32.1 traZODone (DESYREL) 100 MG tablet    citalopram (CELEXA) 20 MG tablet  2. Psychophysiological insomnia F51.04 traZODone (DESYREL) 100 MG tablet    citalopram (CELEXA) 20 MG tablet  3. Work stress Z56.6 citalopram (CELEXA) 20 MG tablet    Past Psychiatric History: See intake H&P for full details. Reviewed, with no updates at this time.   Past Medical History:  Past Medical History:  Diagnosis Date  . Anemia   . Anxiety   . Arnold-Chiari malformation (HCC) 02/11/2014  . Depression   . Hypertension   . Obesity     Past Surgical History:  Procedure Laterality Date  . NONE      Family Psychiatric History: See intake H&P for full details. Reviewed, with no updates at this time.   Family History:  Family History  Problem Relation Age of Onset  . Hypertension Mother   . Depression Maternal Grandmother     Social History:  Social History   Socioeconomic History  . Marital status: Single    Spouse name: Not on file  . Number of children: 0  . Years of education: COLLEGE  . Highest  education level: Not on file  Occupational History    Employer: [employer]  Social Needs  . Financial resource strain: Not on file  . Food insecurity:    Worry: Not on file    Inability: Not on file  . Transportation needs:    Medical: Not on file    Non-medical: Not on file  Tobacco Use  . Smoking status: Never Smoker  . Smokeless tobacco: Never Used  Substance and Sexual Activity  . Alcohol use: Yes    Comment: social   . Drug use: No  . Sexual activity: Yes    Birth control/protection: None  Lifestyle  . Physical activity:    Days per week: Not on file    Minutes per session: Not on file  . Stress: Not on file  Relationships  . Social connections:    Talks on phone: Not on file    Gets together: Not on file    Attends religious service: Not on file    Active member of club or organization: Not on file    Attends meetings of clubs or organizations: Not on file    Relationship status: Not on file  Other Topics Concern  . Not on file  Social History Narrative  . Not on file    Allergies:  Allergies  Allergen Reactions  . Vicodin [Hydrocodone-Acetaminophen] Itching  Metabolic Disorder Labs: No results found for: HGBA1C, MPG No results found for: PROLACTIN Lab Results  Component Value Date   CHOL 139 03/26/2013   TRIG 139 03/26/2013   HDL 40 03/26/2013   CHOLHDL 3.5 03/26/2013   VLDL 28 03/26/2013   LDLCALC 71 03/26/2013   LDLCALC 87 10/13/2008   Lab Results  Component Value Date   TSH 1.980 10/13/2008    Therapeutic Level Labs: No results found for: LITHIUM No results found for: VALPROATE No components found for:  CBMZ  Current Medications: Current Outpatient Medications  Medication Sig Dispense Refill  . Biotin w/ Vitamins C & E (HAIR SKIN & NAILS GUMMIES PO) Take 2 tablets by mouth daily.    . citalopram (CELEXA) 20 MG tablet Take 1 tablet (20 mg total) by mouth daily. Take in the morning 90 tablet 1  . clonazePAM (KLONOPIN) 0.5 MG tablet Take  1 tablet (0.5 mg total) by mouth 2 (two) times daily. Take one tablet in the morning every day, and 1 tablet at night if needed 60 tablet 1  . hydrochlorothiazide (HYDRODIURIL) 12.5 MG tablet Take 12.5 mg by mouth daily.    Marland Kitchen ibuprofen (ADVIL,MOTRIN) 200 MG tablet Take 200 mg by mouth every 6 (six) hours as needed (For pain.).    Marland Kitchen Multiple Vitamins-Minerals (ADULT GUMMY PO) Take 2 tablets by mouth daily.    . traZODone (DESYREL) 100 MG tablet Take 1 tablet (100 mg total) by mouth at bedtime as needed for sleep. 90 tablet 0   No current facility-administered medications for this visit.      Musculoskeletal: Strength & Muscle Tone: within normal limits Gait & Station: normal Patient leans: N/A  Psychiatric Specialty Exam: ROS  Blood pressure (!) 141/89, pulse 88, height 5' 3.5" (1.613 m), weight (!) 317 lb (143.8 kg), SpO2 99 %.Body mass index is 55.27 kg/m.  General Appearance: Casual and Well Groomed  Eye Contact:  Good  Speech:  Clear and Coherent and Normal Rate  Volume:  Normal  Mood:  Euthymic  Affect:  Appropriate and Congruent  Thought Process:  Goal Directed and Descriptions of Associations: Intact  Orientation:  Full (Time, Place, and Person)  Thought Content: Logical   Suicidal Thoughts:  No  Homicidal Thoughts:  No  Memory:  Immediate;   Good  Judgement:  Good  Insight:  Good  Psychomotor Activity:  Normal  Concentration:  Attention Span: Good  Recall:  Good  Fund of Knowledge: Good  Language: Good  Akathisia:  Negative  Handed:  Right  AIMS (if indicated): not done  Assets:  Communication Skills Desire for Improvement Financial Resources/Insurance Housing Intimacy Social Support Transportation Vocational/Educational  ADL's:  Intact  Cognition: WNL  Sleep:  Good   Screenings: PHQ2-9     Erroneous Encounter from 12/22/2013 in [facility]  PHQ-2 Total Score  0       Assessment and Plan:  [name] presents with  stable mood and anxiety symptoms, doing well with Celexa 20 mg daily.  She is rarely using the trazodone or clonazepam.  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  No acute safety issues and she has good support from her fianc and local family.  We will follow-up in 2-3 months or sooner if needed.  Disclosed to  patient that this Clinical research associate is leaving this practice at the end of August 2019, and patients always has the right to choose their provider. Reassured patient that office will work to provide smooth transition of care whether they wish to remain at this office, or to continue with this provider, or seek alternative care options in community.  They expressed understanding.   1. 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 Psychophysiological insomnia   3. Work stress     Status of current problems: stable  Labs Ordered: No orders of the defined types were placed in this encounter.   Labs Reviewed: n/a  Collateral Obtained/Records Reviewed: n/a  Plan:  celexa 20 mg daily Trazodone prn for sleep Clonazepam for acute panic only  I spent 20 minutes with the patient in direct face-to-face clinical care.  Greater than 50% of this time was spent in counseling and coordination of care with the patient.    Burnard Leigh, MD 11/18/2017, 12:23 PM

## 2017-11-19 ENCOUNTER — Ambulatory Visit: Payer: Self-pay | Admitting: Family Medicine

## 2017-11-19 VITALS — BP 145/90 | HR 90 | Temp 97.6°F | Resp 16 | Wt 320.2 lb

## 2017-11-19 DIAGNOSIS — I10 Essential (primary) hypertension: Secondary | ICD-10-CM

## 2017-11-19 MED ORDER — HYDROCHLOROTHIAZIDE 12.5 MG PO TABS: 12.5000 mg | ORAL_TABLET | Freq: Every day | ORAL | 0 refills | Status: DC

## 2017-11-19 NOTE — Progress Notes (Signed)
Jeanette Santos is a 37 y.o. female who presents for medication refill for hypertension medications HCTZ 12.5 mg daily. She has seen a provider in the last year for this condition and was under care until her PCP's office closed. She has established care and is pending an appointment on 17 Dec 2017. She is requesting a short term refill until she can get definitive treatment.  Review of Systems  Constitutional: Negative for chills, fever and malaise/fatigue.  HENT: Negative for congestion, ear discharge, ear pain, sinus pain and sore throat.   Eyes: Negative.   Respiratory: Negative for cough, sputum production and shortness of breath.   Cardiovascular: Negative.  Negative for chest pain.  Gastrointestinal: Negative for abdominal pain, diarrhea, nausea and vomiting.  Genitourinary: Negative for dysuria, frequency, hematuria and urgency.  Musculoskeletal: Negative for myalgias.  Skin: Negative.   Neurological: Positive for headaches.  Endo/Heme/Allergies: Negative.   Psychiatric/Behavioral: Negative.    O: Vitals:   11/19/17 1819  BP: (!) 145/90  Pulse: 90  Resp: 16  Temp: 97.6 F (36.4 C)  SpO2: 99%   Physical Exam  Constitutional: She is oriented to person, place, and time. Vital signs are normal. She appears well-developed and well-nourished. She is active.  Non-toxic appearance. She does not have a sickly appearance.  HENT:  Head: Normocephalic.  Right Ear: Hearing, tympanic membrane, external ear and ear canal normal.  Left Ear: Hearing, tympanic membrane, external ear and ear canal normal.  Nose: Nose normal.  Mouth/Throat: Uvula is midline and oropharynx is clear and moist.  Neck: Normal range of motion. Neck supple.  Cardiovascular: Normal rate, regular rhythm, normal heart sounds and normal pulses.  Pulmonary/Chest: Effort normal and breath sounds normal.  Abdominal: Soft. Bowel sounds are normal.  Musculoskeletal: Normal range of motion.  Lymphadenopathy:       Head  (right side): No submental and no submandibular adenopathy present.       Head (left side): No submental and no submandibular adenopathy present.    She has no cervical adenopathy.  Neurological: She is alert and oriented to person, place, and time.  Psychiatric: She has a normal mood and affect.  Vitals reviewed.  A: 1. Hypertension, unspecified type     P: Complementary medication refill until patient can establish care, healthy diet, exercise and weight loss discussed with patient. Patient did express concerns about frequent urination as unwanted side effect of medication advised to discuss concerns with PCP for possible alternative medications based on eval. Exam findings, diagnosis etiology and medication use and indications reviewed with patient. Follow- Up and discharge instructions provided. No emergent/urgent issues found on exam.  Patient verbalized understanding of information provided and agrees with plan of care (POC), all questions answered.   1. Hypertension, unspecified type - hydrochlorothiazide (HYDRODIURIL) 12.5 MG tablet; Take 1 tablet (12.5 mg total) by mouth daily.

## 2017-11-19 NOTE — Patient Instructions (Signed)
Calorie Counting for Weight Loss Calories are units of energy. Your body needs a certain amount of calories from food to keep you going throughout the day. When you eat more calories than your body needs, your body stores the extra calories as fat. When you eat fewer calories than your body needs, your body burns fat to get the energy it needs. Calorie counting means keeping track of how many calories you eat and drink each day. Calorie counting can be helpful if you need to lose weight. If you make sure to eat fewer calories than your body needs, you should lose weight. Ask your health care provider what a healthy weight is for you. For calorie counting to work, you will need to eat the right number of calories in a day in order to lose a healthy amount of weight per week. A dietitian can help you determine how many calories you need in a day and will give you suggestions on how to reach your calorie goal.  A healthy amount of weight to lose per week is usually 1-2 lb (0.5-0.9 kg). This usually means that your daily calorie intake should be reduced by 500-750 calories.  Eating 1,200 - 1,500 calories per day can help most women lose weight.  Eating 1,500 - 1,800 calories per day can help most men lose weight.  What is my plan? My goal is to have __________ calories per day. If I have this many calories per day, I should lose around __________ pounds per week. What do I need to know about calorie counting? In order to meet your daily calorie goal, you will need to:  Find out how many calories are in each food you would like to eat. Try to do this before you eat.  Decide how much of the food you plan to eat.  Write down what you ate and how many calories it had. Doing this is called keeping a food log.  To successfully lose weight, it is important to balance calorie counting with a healthy lifestyle that includes regular activity. Aim for 150 minutes of moderate exercise (such as walking) or 75  minutes of vigorous exercise (such as running) each week. Where do I find calorie information?  The number of calories in a food can be found on a Nutrition Facts label. If a food does not have a Nutrition Facts label, try to look up the calories online or ask your dietitian for help. Remember that calories are listed per serving. If you choose to have more than one serving of a food, you will have to multiply the calories per serving by the amount of servings you plan to eat. For example, the label on a package of bread might say that a serving size is 1 slice and that there are 90 calories in a serving. If you eat 1 slice, you will have eaten 90 calories. If you eat 2 slices, you will have eaten 180 calories. How do I keep a food log? Immediately after each meal, record the following information in your food log:  What you ate. Don't forget to include toppings, sauces, and other extras on the food.  How much you ate. This can be measured in cups, ounces, or number of items.  How many calories each food and drink had.  The total number of calories in the meal.  Keep your food log near you, such as in a small notebook in your pocket, or use a mobile app or website. Some   programs will calculate calories for you and show you how many calories you have left for the day to meet your goal. What are some calorie counting tips?  Use your calories on foods and drinks that will fill you up and not leave you hungry: ? Some examples of foods that fill you up are nuts and nut butters, vegetables, lean proteins, and high-fiber foods like whole grains. High-fiber foods are foods with more than 5 g fiber per serving. ? Drinks such as sodas, specialty coffee drinks, alcohol, and juices have a lot of calories, yet do not fill you up.  Eat nutritious foods and avoid empty calories. Empty calories are calories you get from foods or beverages that do not have many vitamins or protein, such as candy, sweets, and  soda. It is better to have a nutritious high-calorie food (such as an avocado) than a food with few nutrients (such as a bag of chips).  Know how many calories are in the foods you eat most often. This will help you calculate calorie counts faster.  Pay attention to calories in drinks. Low-calorie drinks include water and unsweetened drinks.  Pay attention to nutrition labels for "low fat" or "fat free" foods. These foods sometimes have the same amount of calories or more calories than the full fat versions. They also often have added sugar, starch, or salt, to make up for flavor that was removed with the fat.  Find a way of tracking calories that works for you. Get creative. Try different apps or programs if writing down calories does not work for you. What are some portion control tips?  Know how many calories are in a serving. This will help you know how many servings of a certain food you can have.  Use a measuring cup to measure serving sizes. You could also try weighing out portions on a kitchen scale. With time, you will be able to estimate serving sizes for some foods.  Take some time to put servings of different foods on your favorite plates, bowls, and cups so you know what a serving looks like.  Try not to eat straight from a bag or box. Doing this can lead to overeating. Put the amount you would like to eat in a cup or on a plate to make sure you are eating the right portion.  Use smaller plates, glasses, and bowls to prevent overeating.  Try not to multitask (for example, watch TV or use your computer) while eating. If it is time to eat, sit down at a table and enjoy your food. This will help you to know when you are full. It will also help you to be aware of what you are eating and how much you are eating. What are tips for following this plan? Reading food labels  Check the calorie count compared to the serving size. The serving size may be smaller than what you are used to  eating.  Check the source of the calories. Make sure the food you are eating is high in vitamins and protein and low in saturated and trans fats. Shopping  Read nutrition labels while you shop. This will help you make healthy decisions before you decide to purchase your food.  Make a grocery list and stick to it. Cooking  Try to cook your favorite foods in a healthier way. For example, try baking instead of frying.  Use low-fat dairy products. Meal planning  Use more fruits and vegetables. Half of your plate should   be fruits and vegetables.  Include lean proteins like poultry and fish. How do I count calories when eating out?  Ask for smaller portion sizes.  Consider sharing an entree and sides instead of getting your own entree.  If you get your own entree, eat only half. Ask for a box at the beginning of your meal and put the rest of your entree in it so you are not tempted to eat it.  If calories are listed on the menu, choose the lower calorie options.  Choose dishes that include vegetables, fruits, whole grains, low-fat dairy products, and lean protein.  Choose items that are boiled, broiled, grilled, or steamed. Stay away from items that are buttered, battered, fried, or served with cream sauce. Items labeled "crispy" are usually fried, unless stated otherwise.  Choose water, low-fat milk, unsweetened iced tea, or other drinks without added sugar. If you want an alcoholic beverage, choose a lower calorie option such as a glass of wine or light beer.  Ask for dressings, sauces, and syrups on the side. These are usually high in calories, so you should limit the amount you eat.  If you want a salad, choose a garden salad and ask for grilled meats. Avoid extra toppings like bacon, cheese, or fried items. Ask for the dressing on the side, or ask for olive oil and vinegar or lemon to use as dressing.  Estimate how many servings of a food you are given. For example, a serving of  cooked rice is  cup or about the size of half a baseball. Knowing serving sizes will help you be aware of how much food you are eating at restaurants. The list below tells you how big or small some common portion sizes are based on everyday objects: ? 1 oz-4 stacked dice. ? 3 oz-1 deck of cards. ? 1 tsp-1 die. ? 1 Tbsp- a ping-pong ball. ? 2 Tbsp-1 ping-pong ball. ?  cup- baseball. ? 1 cup-1 baseball. Summary  Calorie counting means keeping track of how many calories you eat and drink each day. If you eat fewer calories than your body needs, you should lose weight.  A healthy amount of weight to lose per week is usually 1-2 lb (0.5-0.9 kg). This usually means reducing your daily calorie intake by 500-750 calories.  The number of calories in a food can be found on a Nutrition Facts label. If a food does not have a Nutrition Facts label, try to look up the calories online or ask your dietitian for help.  Use your calories on foods and drinks that will fill you up, and not on foods and drinks that will leave you hungry.  Use smaller plates, glasses, and bowls to prevent overeating. This information is not intended to replace advice given to you by your health care provider. Make sure you discuss any questions you have with your health care provider. Document Released: 07/08/2005 Document Revised: 06/07/2016 Document Reviewed: 06/07/2016 Elsevier Interactive Patient Education  2018 [employer]. Hypertension Hypertension, commonly called high blood pressure, is when the force of blood pumping through the arteries is too strong. The arteries are the blood vessels that carry blood from the heart throughout the body. Hypertension forces the heart to work harder to pump blood and may cause arteries to become narrow or stiff. Having untreated or uncontrolled hypertension can cause heart attacks, strokes, kidney disease, and other problems. A blood pressure reading consists of a higher number over  a lower number. Ideally, your blood  pressure should be below 120/80. The first ("top") number is called the systolic pressure. It is a measure of the pressure in your arteries as your heart beats. The second ("bottom") number is called the diastolic pressure. It is a measure of the pressure in your arteries as the heart relaxes. What are the causes? The cause of this condition is not known. What increases the risk? Some risk factors for high blood pressure are under your control. Others are not. Factors you can change  Smoking.  Having type 2 diabetes mellitus, high cholesterol, or both.  Not getting enough exercise or physical activity.  Being overweight.  Having too much fat, sugar, calories, or salt (sodium) in your diet.  Drinking too much alcohol. Factors that are difficult or impossible to change  Having chronic kidney disease.  Having a family history of high blood pressure.  Age. Risk increases with age.  Race. You may be at higher risk if you are African-American.  Gender. Men are at higher risk than women before age 96. After age 10, women are at higher risk than men.  Having obstructive sleep apnea.  Stress. What are the signs or symptoms? Extremely high blood pressure (hypertensive crisis) may cause:  Headache.  Anxiety.  Shortness of breath.  Nosebleed.  Nausea and vomiting.  Severe chest pain.  Jerky movements you cannot control (seizures).  How is this diagnosed? This condition is diagnosed by measuring your blood pressure while you are seated, with your arm resting on a surface. The cuff of the blood pressure monitor will be placed directly against the skin of your upper arm at the level of your heart. It should be measured at least twice using the same arm. Certain conditions can cause a difference in blood pressure between your right and left arms. Certain factors can cause blood pressure readings to be lower or higher than normal (elevated) for a  short period of time:  When your blood pressure is higher when you are in a health care provider's office than when you are at home, this is called white coat hypertension. Most people with this condition do not need medicines.  When your blood pressure is higher at home than when you are in a health care provider's office, this is called masked hypertension. Most people with this condition may need medicines to control blood pressure.  If you have a high blood pressure reading during one visit or you have normal blood pressure with other risk factors:  You may be asked to return on a different day to have your blood pressure checked again.  You may be asked to monitor your blood pressure at home for 1 week or longer.  If you are diagnosed with hypertension, you may have other blood or imaging tests to help your health care provider understand your overall risk for other conditions. How is this treated? This condition is treated by making healthy lifestyle changes, such as eating healthy foods, exercising more, and reducing your alcohol intake. Your health care provider may prescribe medicine if lifestyle changes are not enough to get your blood pressure under control, and if:  Your systolic blood pressure is above 130.  Your diastolic blood pressure is above 80.  Your personal target blood pressure may vary depending on your medical conditions, your age, and other factors. Follow these instructions at home: Eating and drinking  Eat a diet that is high in fiber and potassium, and low in sodium, added sugar, and fat. An example eating  plan is called the DASH (Dietary Approaches to Stop Hypertension) diet. To eat this way: ? Eat plenty of fresh fruits and vegetables. Try to fill half of your plate at each meal with fruits and vegetables. ? Eat whole grains, such as whole wheat pasta, brown rice, or whole grain bread. Fill about one quarter of your plate with whole grains. ? Eat or drink  low-fat dairy products, such as skim milk or low-fat yogurt. ? Avoid fatty cuts of meat, processed or cured meats, and poultry with skin. Fill about one quarter of your plate with lean proteins, such as fish, chicken without skin, beans, eggs, and tofu. ? Avoid premade and processed foods. These tend to be higher in sodium, added sugar, and fat.  Reduce your daily sodium intake. Most people with hypertension should eat less than 1,500 mg of sodium a day.  Limit alcohol intake to no more than 1 drink a day for nonpregnant women and 2 drinks a day for men. One drink equals 12 oz of beer, 5 oz of wine, or 1 oz of hard liquor. Lifestyle  Work with your health care provider to maintain a healthy body weight or to lose weight. Ask what an ideal weight is for you.  Get at least 30 minutes of exercise that causes your heart to beat faster (aerobic exercise) most days of the week. Activities may include walking, swimming, or biking.  Include exercise to strengthen your muscles (resistance exercise), such as pilates or lifting weights, as part of your weekly exercise routine. Try to do these types of exercises for 30 minutes at least 3 days a week.  Do not use any products that contain nicotine or tobacco, such as cigarettes and e-cigarettes. If you need help quitting, ask your health care provider.  Monitor your blood pressure at home as told by your health care provider.  Keep all follow-up visits as told by your health care provider. This is important. Medicines  Take over-the-counter and prescription medicines only as told by your health care provider. Follow directions carefully. Blood pressure medicines must be taken as prescribed.  Do not skip doses of blood pressure medicine. Doing this puts you at risk for problems and can make the medicine less effective.  Ask your health care provider about side effects or reactions to medicines that you should watch for. Contact a health care provider  if:  You think you are having a reaction to a medicine you are taking.  You have headaches that keep coming back (recurring).  You feel dizzy.  You have swelling in your ankles.  You have trouble with your vision. Get help right away if:  You develop a severe headache or confusion.  You have unusual weakness or numbness.  You feel faint.  You have severe pain in your chest or abdomen.  You vomit repeatedly.  You have trouble breathing. Summary  Hypertension is when the force of blood pumping through your arteries is too strong. If this condition is not controlled, it may put you at risk for serious complications.  Your personal target blood pressure may vary depending on your medical conditions, your age, and other factors. For most people, a normal blood pressure is less than 120/80.  Hypertension is treated with lifestyle changes, medicines, or a combination of both. Lifestyle changes include weight loss, eating a healthy, low-sodium diet, exercising more, and limiting alcohol. This information is not intended to replace advice given to you by your health care provider. Make sure  you discuss any questions you have with your health care provider. Document Released: 07/08/2005 Document Revised: 06/05/2016 Document Reviewed: 06/05/2016 Elsevier Interactive Patient Education  [employer].

## 2018-01-01 ENCOUNTER — Ambulatory Visit (HOSPITAL_COMMUNITY): Payer: 59 | Admitting: Psychiatry

## 2018-01-02 ENCOUNTER — Ambulatory Visit (HOSPITAL_COMMUNITY): Payer: 59 | Admitting: Psychiatry

## 2018-01-13 ENCOUNTER — Encounter (HOSPITAL_COMMUNITY): Payer: Self-pay | Admitting: Psychiatry

## 2018-01-13 ENCOUNTER — Ambulatory Visit (INDEPENDENT_AMBULATORY_CARE_PROVIDER_SITE_OTHER): Payer: PRIVATE HEALTH INSURANCE | Admitting: Psychiatry

## 2018-01-13 DIAGNOSIS — F5104 Psychophysiologic insomnia: Secondary | ICD-10-CM

## 2018-01-13 DIAGNOSIS — F321 Major depressive disorder, single episode, moderate: Secondary | ICD-10-CM | POA: Diagnosis not present

## 2018-01-13 DIAGNOSIS — Z818 Family history of other mental and behavioral disorders: Secondary | ICD-10-CM | POA: Diagnosis not present

## 2018-01-13 DIAGNOSIS — Z566 Other physical and mental strain related to work: Secondary | ICD-10-CM | POA: Diagnosis not present

## 2018-01-13 MED ORDER — TRAZODONE HCL 100 MG PO TABS: 100.0000 mg | ORAL_TABLET | Freq: Every evening | ORAL | 0 refills | Status: DC | PRN

## 2018-01-13 MED ORDER — CITALOPRAM HYDROBROMIDE 40 MG PO TABS: 40.0000 mg | ORAL_TABLET | Freq: Every day | ORAL | 1 refills | Status: DC

## 2018-01-13 NOTE — Progress Notes (Signed)
BH MD/PA/NP OP Progress Note  01/13/2018 12:41 PM Jeanette Santos  MRN:  [id number]  Chief Complaint: okay, stressed  HPI: Jeanette Santos continues to work on compartmentalization and being able to leave her work behind.  She continues to be very irritated by her work environment and the expectations that she sell things that she does not feel are necessary.  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  She feels hopeful for what things will hold after that.  Denies any suicidal thoughts, and reports that she is sleeping well with trazodone.  We discussed an increase in Celexa to 40 mg to assist with overall robust remission of symptoms.  Disclosed to patient that this Clinical research associatewriter is leaving this practice at the end of August 2019, and patients always has the right to choose their provider. Reassured patient that office will work to provide smooth transition of care whether they wish to remain at this office, or to continue with this provider, or seek alternative care options in community.  They expressed understanding.   Visit Diagnosis:    ICD-10-CM   1. Current moderate episode of major depressive disorder without prior episode (HCC) F32.1 citalopram (CELEXA) 40 MG tablet    traZODone (DESYREL) 100 MG tablet  2. Psychophysiological insomnia F51.04 citalopram (CELEXA) 40 MG tablet    traZODone (DESYREL) 100 MG tablet  3. Work stress Z56.6 citalopram (CELEXA) 40 MG tablet    Past Psychiatric History: See intake H&P for full details. Reviewed, with no updates at this time.   Past Medical History:  Past Medical History:  Diagnosis Date  . Anemia   . Anxiety   . Arnold-Chiari malformation (HCC) 02/11/2014  . Depression   . Hypertension   . Obesity     Past Surgical History:  Procedure Laterality Date  . NONE      Family Psychiatric History: See intake H&P for full details. Reviewed, with no updates at this time.   Family  History:  Family History  Problem Relation Age of Onset  . Hypertension Mother   . Depression Maternal Grandmother     Social History:  Social History   Socioeconomic History  . Marital status: Single    Spouse name: Not on file  . Number of children: 0  . Years of education: COLLEGE  . Highest education level: Not on file  Occupational History    Employer: [employer]  Social Needs  . Financial resource strain: Not on file  . Food insecurity:    Worry: Not on file    Inability: Not on file  . Transportation needs:    Medical: Not on file    Non-medical: Not on file  Tobacco Use  . Smoking status: Never Smoker  . Smokeless tobacco: Never Used  Substance and Sexual Activity  . Alcohol use: Yes    Comment: social   . Drug use: No  . Sexual activity: Yes    Birth control/protection: None  Lifestyle  . Physical activity:    Days per week: Not on file    Minutes per session: Not on file  . Stress: Not on file  Relationships  . Social connections:    Talks on phone: Not on file    Gets together: Not on file    Attends religious service: Not on file    Active member of club or organization: Not on file    Attends meetings of clubs or organizations:  Not on file    Relationship status: Not on file  Other Topics Concern  . Not on file  Social History Narrative  . Not on file    Allergies:  Allergies  Allergen Reactions  . Vicodin [Hydrocodone-Acetaminophen] Itching    Metabolic Disorder Labs: No results found for: HGBA1C, MPG No results found for: PROLACTIN Lab Results  Component Value Date   CHOL 139 03/26/2013   TRIG 139 03/26/2013   HDL 40 03/26/2013   CHOLHDL 3.5 03/26/2013   VLDL 28 03/26/2013   LDLCALC 71 03/26/2013   LDLCALC 87 10/13/2008   Lab Results  Component Value Date   TSH 1.980 10/13/2008    Therapeutic Level Labs: No results found for: LITHIUM No results found for: VALPROATE No components found for:  CBMZ  Current  Medications: Current Outpatient Medications  Medication Sig Dispense Refill  . amLODipine (NORVASC) 5 MG tablet Take by mouth.    . Biotin w/ Vitamins C & E (HAIR SKIN & NAILS GUMMIES PO) Take 2 tablets by mouth daily.    . citalopram (CELEXA) 40 MG tablet Take 1 tablet (40 mg total) by mouth daily. Take in the morning 90 tablet 1  . cyclobenzaprine (FLEXERIL) 5 MG tablet     . ibuprofen (ADVIL,MOTRIN) 200 MG tablet Take 200 mg by mouth every 6 (six) hours as needed (For pain.).    Marland Kitchen Multiple Vitamins-Minerals (ADULT GUMMY PO) Take 2 tablets by mouth daily.    . traZODone (DESYREL) 100 MG tablet Take 1 tablet (100 mg total) by mouth at bedtime as needed for sleep. 90 tablet 0   No current facility-administered medications for this visit.     Musculoskeletal: Strength & Muscle Tone: within normal limits Gait & Station: normal Patient leans: N/A  Psychiatric Specialty Exam: ROS  Blood pressure (!) 139/93, pulse 84, height 5\' 3"  (1.6 m), weight (!) 321 lb (145.6 kg).Body mass index is 56.86 kg/m.  General Appearance: Casual and Well Groomed  Eye Contact:  Good  Speech:  Clear and Coherent and Normal Rate  Volume:  Normal  Mood:  Dysphoric  Affect:  Appropriate and Congruent  Thought Process:  Goal Directed and Descriptions of Associations: Intact  Orientation:  Full (Time, Place, and Person)  Thought Content: Logical   Suicidal Thoughts:  No  Homicidal Thoughts:  No  Memory:  Immediate;   Good  Judgement:  Good  Insight:  Good  Psychomotor Activity:  Normal  Concentration:  Attention Span: Good  Recall:  Good  Fund of Knowledge: Good  Language: Good  Akathisia:  Negative  Handed:  Right  AIMS (if indicated): not done  Assets:  Communication Skills Desire for Improvement Financial Resources/Insurance Housing Intimacy Social Support Transportation Vocational/Educational  ADL's:  Intact  Cognition: WNL  Sleep:  Good   Screenings: PHQ2-9     Erroneous Encounter  from 12/22/2013 in [facility]  PHQ-2 Total Score  0       Assessment and Plan:  Jeanette Santos presents with some difficulty in maintaining her mood and stress levels in the face of ongoing work stressors.  She does feel that her work is the main source of her distress and frustration, and reports that things in her personal life are overall fairly stable.  We agreed to increase Celexa to 40 mg with the goal of her being able to focus on compartmentalization and being able to provide her some ability to mentally partition off her work so  that she is able to focus on the good things in her life and her positive relationships.  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   1. 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 Psychophysiological insomnia   3. Work stress     Status of current problems: stable  Labs Ordered: No orders of the defined types were placed in this encounter.   Labs Reviewed: n/a  Collateral Obtained/Records Reviewed: n/a  Plan:  Increase Celexa to 40 mg daily Trazodone prn for sleep Clonazepam for acute panic only  Burnard Leigh, MD 01/13/2018, 12:41 PM

## 2019-03-04 ENCOUNTER — Other Ambulatory Visit: Payer: Self-pay | Admitting: Orthopedic Surgery

## 2019-03-04 DIAGNOSIS — M25511 Pain in right shoulder: Secondary | ICD-10-CM

## 2019-03-08 ENCOUNTER — Other Ambulatory Visit: Payer: Self-pay

## 2019-03-08 DIAGNOSIS — Z20822 Contact with and (suspected) exposure to covid-19: Secondary | ICD-10-CM

## 2019-03-10 LAB — NOVEL CORONAVIRUS, NAA: SARS-CoV-2, NAA: NOT DETECTED

## 2019-03-25 ENCOUNTER — Other Ambulatory Visit: Payer: Self-pay | Admitting: Obstetrics & Gynecology

## 2019-03-25 DIAGNOSIS — Z3141 Encounter for fertility testing: Secondary | ICD-10-CM

## 2019-04-01 ENCOUNTER — Ambulatory Visit
Admission: RE | Admit: 2019-04-01 | Discharge: 2019-04-01 | Disposition: A | Discharge status: Home / Self Care | Payer: 59 | Source: Ambulatory Visit | Attending: Obstetrics & Gynecology | Admitting: Obstetrics & Gynecology

## 2019-04-01 DIAGNOSIS — Z3141 Encounter for fertility testing: Secondary | ICD-10-CM

## 2019-04-08 ENCOUNTER — Ambulatory Visit
Admission: RE | Admit: 2019-04-08 | Discharge: 2019-04-08 | Disposition: A | Discharge status: Home / Self Care | Payer: 59 | Source: Ambulatory Visit | Attending: Orthopedic Surgery | Admitting: Orthopedic Surgery

## 2019-04-08 ENCOUNTER — Other Ambulatory Visit: Payer: Self-pay

## 2019-04-08 DIAGNOSIS — M25511 Pain in right shoulder: Secondary | ICD-10-CM

## 2019-05-05 ENCOUNTER — Other Ambulatory Visit: Payer: Self-pay

## 2019-05-05 ENCOUNTER — Emergency Department (HOSPITAL_COMMUNITY): Payer: 59

## 2019-05-05 ENCOUNTER — Encounter (HOSPITAL_COMMUNITY): Payer: Self-pay

## 2019-05-05 ENCOUNTER — Emergency Department (HOSPITAL_COMMUNITY)
Admission: EM | Admit: 2019-05-05 | Discharge: 2019-05-05 | Disposition: A | Discharge status: Home / Self Care | Payer: 59 | Attending: Emergency Medicine | Admitting: Emergency Medicine

## 2019-05-05 DIAGNOSIS — Z79899 Other long term (current) drug therapy: Secondary | ICD-10-CM | POA: Insufficient documentation

## 2019-05-05 DIAGNOSIS — R42 Dizziness and giddiness: Secondary | ICD-10-CM | POA: Insufficient documentation

## 2019-05-05 DIAGNOSIS — I10 Essential (primary) hypertension: Secondary | ICD-10-CM | POA: Insufficient documentation

## 2019-05-05 LAB — BASIC METABOLIC PANEL
Anion gap: 7 (ref 5–15)
BUN: 6 mg/dL (ref 6–20)
CO2: 26 mmol/L (ref 22–32)
Calcium: 9.3 mg/dL (ref 8.9–10.3)
Chloride: 104 mmol/L (ref 98–111)
Creatinine, Ser: 0.74 mg/dL (ref 0.44–1.00)
GFR calc Af Amer: >60 mL/min (ref >60)
GFR calc non Af Amer: >60 mL/min (ref >60)
Glucose, Bld: 94 mg/dL (ref 70–99)
Potassium: 3.9 mmol/L (ref 3.5–5.1)
Sodium: 137 mmol/L (ref 135–145)

## 2019-05-05 LAB — CBC
HCT: 39 % (ref 36.0–46.0)
Hemoglobin: 12.3 g/dL (ref 12.0–15.0)
MCH: 27.9 pg (ref 26.0–34.0)
MCHC: 31.5 g/dL (ref 30.0–36.0)
MCV: 88.4 fL (ref 80.0–100.0)
Platelets: 387 10*3/uL (ref 150–400)
RBC: 4.41 MIL/uL (ref 3.87–5.11)
RDW: 14.7 % (ref 11.5–15.5)
WBC: 6.9 10*3/uL (ref 4.0–10.5)
nRBC: 0 % (ref 0.0–0.2)

## 2019-05-05 LAB — URINALYSIS, ROUTINE W REFLEX MICROSCOPIC
Bilirubin Urine: NEGATIVE
Glucose, UA: NEGATIVE mg/dL
Hgb urine dipstick: NEGATIVE
Ketones, ur: 20 mg/dL — AB
Leukocytes,Ua: NEGATIVE
Nitrite: NEGATIVE
Protein, ur: NEGATIVE mg/dL
Specific Gravity, Urine: 1.017 (ref 1.005–1.030)
pH: 5 (ref 5.0–8.0)

## 2019-05-05 LAB — PREGNANCY, URINE: Preg Test, Ur: NEGATIVE

## 2019-05-05 LAB — I-STAT BETA HCG BLOOD, ED (MC, WL, AP ONLY): I-stat hCG, quantitative: <5 m[IU]/mL (ref <5)

## 2019-05-05 LAB — CBG MONITORING, ED: Glucose-Capillary: 80 mg/dL (ref 70–99)

## 2019-05-05 MED ORDER — CLONIDINE HCL 0.1 MG PO TABS
0.1000 mg | ORAL_TABLET | Freq: Once | ORAL | Status: AC
  Administered 2019-05-05: 0.1 mg via ORAL
  Filled 2019-05-05: qty 1

## 2019-05-05 MED ORDER — SODIUM CHLORIDE 0.9% FLUSH: 3.0000 mL | Freq: Once | INTRAVENOUS | Status: DC

## 2019-05-05 NOTE — Discharge Instructions (Addendum)
CT scan was normal.  Her blood pressure is still elevated, but it has improved.  Off work Architectural technologist.  Rest.  Call your primary care doctor in the morning for recommendations on your blood pressure medicine

## 2019-05-05 NOTE — ED Provider Notes (Addendum)
[facility] DEPT Provider Note   CSN: [id number] Arrival date & time: 05/05/19  1356     History   Chief Complaint Chief Complaint  Patient presents with  . Hypertension  . Dizziness    HPI Jeanette Santos is a 38 y.o. female.     Chief complaint hypertension and right facial numbness.  Known diagnosis of hypertension.  She has been taking Norvasc 10 mg daily. Yesterday her doctor increased her medicine to include Lisinopril 10 mg.  Blood pressure readings have been 167/123 and 180/121.  Review of systems positive for right facial numbness.  No confusion, mental status changes, stiff neck, arm or leg weakness, facial asymmetry.  Her primary care doctor suggested she come to the emergency department.     Past Medical History:  Diagnosis Date  . Anemia   . Anxiety   . Arnold-Chiari malformation ([place]) 02/11/2014  . Depression   . Hypertension   . Obesity     Patient Active Problem List   Diagnosis Date Noted  . Depression, major, recurrent, moderate ([place]) 08/11/2017    Class: Acute  . Arnold-Chiari malformation ([place]) 02/11/2014  . GERD (gastroesophageal reflux disease) 03/26/2013  . IRREGULAR MENSES 10/09/2007  . HYPERTENSION, BENIGN ESSENTIAL 04/07/2007  . ASCUS PAP 04/07/2007  . OBESITY, NOS 09/18/2006    Past Surgical History:  Procedure Laterality Date  . NONE       OB History   No obstetric history on file.      Home Medications    Prior to Admission medications   Medication Sig Start Date End Date Taking? Authorizing Provider  amLODipine (NORVASC) 5 MG tablet Take by mouth. 12/24/17  Yes [provider]  Biotin w/ Vitamins C & E (HAIR SKIN & NAILS GUMMIES PO) Take 2 tablets by mouth daily.   Yes [provider]  ferrous sulfate 325 (65 FE) MG tablet Take 325 mg by mouth daily. 02/05/19  Yes [provider]  ibuprofen (ADVIL,MOTRIN) 200 MG tablet Take 200 mg by mouth every 6 (six) hours as  needed (For pain.).   Yes [provider]  lisinopril (ZESTRIL) 10 MG tablet Take 10 mg by mouth daily. 05/04/19  Yes [provider]  meloxicam (MOBIC) 15 MG tablet Take 15 mg by mouth 3 (three) times daily with meals. 04/12/19  Yes [provider]  Multiple Vitamins-Minerals (ADULT GUMMY PO) Take 2 tablets by mouth daily.   Yes [provider]  traZODone (DESYREL) 100 MG tablet Take 1 tablet (100 mg total) by mouth at bedtime as needed for sleep. 01/13/18  Yes Eksir, Richard Miu, MD  citalopram (CELEXA) 40 MG tablet Take 1 tablet (40 mg total) by mouth daily. Take in the morning 01/13/18 01/13/19  Eksir, Richard Miu, MD  hydrOXYzine (VISTARIL) 25 MG capsule Take 25 mg by mouth 3 (three) times daily.  05/05/19   [provider]  TRINTELLIX 5 MG TABS tablet Take 5 mg by mouth daily. 04/25/19   [provider]    Family History Family History  Problem Relation Age of Onset  . Hypertension Mother   . Depression Maternal Grandmother     Social History Social History   Tobacco Use  . Smoking status: Never Smoker  . Smokeless tobacco: Never Used  Substance Use Topics  . Alcohol use: Yes    Comment: social   . Drug use: No     Allergies   Vicodin [hydrocodone-acetaminophen]   Review of Systems Review of  Systems  All other systems reviewed and are negative.    Physical Exam Updated Vital Signs BP (!) 140/103   Pulse 83   Temp 99.3 F (37.4 C) (Oral)   Resp 18   LMP 04/24/2019   SpO2 100%   Physical Exam Vitals signs and nursing note reviewed.  Constitutional:      Appearance: She is well-developed.     Comments: Elevated BMI  HENT:     Head: Normocephalic and atraumatic.  Eyes:     Conjunctiva/sclera: Conjunctivae normal.  Neck:     Musculoskeletal: Neck supple.  Cardiovascular:     Rate and Rhythm: Normal rate and regular rhythm.  Pulmonary:     Effort: Pulmonary effort is normal.     Breath sounds:  Normal breath sounds.  Abdominal:     General: Bowel sounds are normal.     Palpations: Abdomen is soft.  Musculoskeletal: Normal range of motion.  Skin:    General: Skin is warm and dry.  Neurological:     General: No focal deficit present.     Mental Status: She is alert and oriented to person, place, and time.     Comments: No facial asymmetry or numbness.  Normal sensation.  Psychiatric:        Behavior: Behavior normal.      ED Treatments / Results  Labs (all labs ordered are listed, but only abnormal results are displayed) Labs Reviewed  URINALYSIS, ROUTINE W REFLEX MICROSCOPIC - Abnormal; Notable for the following components:      Result Value   Ketones, ur 20 (*)    All other components within normal limits  BASIC METABOLIC PANEL  CBC  PREGNANCY, URINE  CBG MONITORING, ED  I-STAT BETA HCG BLOOD, ED ([facility], [facility], [facility] ONLY)    EKG EKG Interpretation  Date/Time:  Wednesday May 05 2019 14:27:16 EDT Ventricular Rate:  100 PR Interval:    QRS Duration: 91 QT Interval:  344 QTC Calculation: 444 R Axis:   27 Text Interpretation:  Sinus tachycardia Probable left atrial enlargement Baseline wander in lead(s) I II aVR aVL Confirmed by Donnetta Hutchingook, Devondre Guzzetta ([id number]) on 05/05/2019 9:34:03 PM   Radiology Ct Head Wo Contrast  Result Date: 05/05/2019 CLINICAL DATA:  Headache with right facial numbness EXAM: CT HEAD WITHOUT CONTRAST TECHNIQUE: Contiguous axial images were obtained from the base of the skull through the vertex without intravenous contrast. COMPARISON:  None. FINDINGS: Brain: No evidence of acute infarction, hemorrhage, hydrocephalus, extra-axial collection or mass lesion/mass effect. Vascular: No hyperdense vessel or unexpected calcification. Skull: Normal. Negative for fracture or focal lesion. Sinuses/Orbits: No acute finding. Other: None IMPRESSION: Negative non contrasted CT appearance of the brain Electronically Signed   By: Jasmine PangKim  Fujinaga M.D.   On: 05/05/2019 19:50     Procedures Procedures (including critical care time)  Medications Ordered in ED Medications  sodium chloride flush (NS) 0.9 % injection 3 mL (has no administration in time range)  cloNIDine (CATAPRES) tablet 0.1 mg (0.1 mg Oral Given 05/05/19 1747)     Initial Impression / Assessment and Plan / ED Course  I have reviewed the triage vital signs and the nursing notes.  Pertinent labs & imaging results that were available during my care of the patient were reviewed by me and considered in my medical decision making (see chart for details).        Has an elevated blood pressure.  No evidence of a stroke.  However, because of her right facial numbness, will  obtain a CT head.  Clonidine 0.1 mg orally given in the ED.  2130: Recheck.  Blood pressure has improved dramatically.  CT head negative for bleed.  Patient is currently on lisinopril 10 mg and amlodipine 10 mg daily.  She will call her primary care doctor in the morning for further recommendations.  I put her out of work tomorrow.  Final Clinical Impressions(s) / ED Diagnoses   Final diagnoses:  Hypertension, unspecified type  Dizziness    ED Discharge Orders    None       Donnetta Hutching, MD 05/05/19 1737    Donnetta Hutching, MD 05/05/19 2148

## 2019-05-05 NOTE — ED Triage Notes (Signed)
Pt states since Monday, she has been feeling unwell. Pt states that she had low blood sugar Monday, then HTN Tuesday of 188/120. Pt now has a headache and nausea. Pt is also c/o numbness on right side of face. Pt states she has been compliant with BP meds.

## 2019-05-05 NOTE — ED Notes (Signed)
Pt has been made aware that MD needs UA sample.

## 2019-05-08 ENCOUNTER — Encounter (HOSPITAL_COMMUNITY): Payer: Self-pay | Admitting: Emergency Medicine

## 2019-05-08 ENCOUNTER — Emergency Department (HOSPITAL_COMMUNITY)
Admission: EM | Admit: 2019-05-08 | Discharge: 2019-05-08 | Disposition: A | Discharge status: Home / Self Care | Payer: 59 | Attending: Emergency Medicine | Admitting: Emergency Medicine

## 2019-05-08 DIAGNOSIS — R202 Paresthesia of skin: Secondary | ICD-10-CM | POA: Insufficient documentation

## 2019-05-08 DIAGNOSIS — I10 Essential (primary) hypertension: Secondary | ICD-10-CM | POA: Insufficient documentation

## 2019-05-08 DIAGNOSIS — Z79899 Other long term (current) drug therapy: Secondary | ICD-10-CM | POA: Insufficient documentation

## 2019-05-08 LAB — COMPREHENSIVE METABOLIC PANEL
ALT: 30 U/L (ref 0–44)
AST: 25 U/L (ref 15–41)
Albumin: 3.5 g/dL (ref 3.5–5.0)
Alkaline Phosphatase: 55 U/L (ref 38–126)
Anion gap: 11 (ref 5–15)
BUN: 5 mg/dL — ABNORMAL LOW (ref 6–20)
CO2: 24 mmol/L (ref 22–32)
Calcium: 9.1 mg/dL (ref 8.9–10.3)
Chloride: 105 mmol/L (ref 98–111)
Creatinine, Ser: 0.8 mg/dL (ref 0.44–1.00)
GFR calc Af Amer: >60 mL/min (ref >60)
GFR calc non Af Amer: >60 mL/min (ref >60)
Glucose, Bld: 81 mg/dL (ref 70–99)
Potassium: 3.7 mmol/L (ref 3.5–5.1)
Sodium: 140 mmol/L (ref 135–145)
Total Bilirubin: 0.3 mg/dL (ref 0.3–1.2)
Total Protein: 7.5 g/dL (ref 6.5–8.1)

## 2019-05-08 NOTE — ED Provider Notes (Signed)
[facility] EMERGENCY DEPARTMENT Provider Note   CSN: [id number] Arrival date & time: 05/08/19  1420     History   Chief Complaint No chief complaint on file.   HPI Jeanette Santos is a 38 y.o. female.     The history is provided by the patient. No language interpreter was used.  Hypertension This is a new problem. The problem occurs constantly. The problem has not changed since onset.Nothing aggravates the symptoms. Nothing relieves the symptoms. She has tried nothing for the symptoms. The treatment provided no relief.  Pt complains of tingling in the right side of her face.  Pt reports she has had a headache today.  Pt is worried about her blood pressure.  Pt also reports she was recently started on medication of anxiety  Past Medical History:  Diagnosis Date  . Anemia   . Anxiety   . Arnold-Chiari malformation ([place]) 02/11/2014  . Depression   . Hypertension   . Obesity     Patient Active Problem List   Diagnosis Date Noted  . Depression, major, recurrent, moderate ([place]) 08/11/2017    Class: Acute  . Arnold-Chiari malformation ([place]) 02/11/2014  . GERD (gastroesophageal reflux disease) 03/26/2013  . IRREGULAR MENSES 10/09/2007  . HYPERTENSION, BENIGN ESSENTIAL 04/07/2007  . ASCUS PAP 04/07/2007  . OBESITY, NOS 09/18/2006    Past Surgical History:  Procedure Laterality Date  . NONE       OB History   No obstetric history on file.      Home Medications    Prior to Admission medications   Medication Sig Start Date End Date Taking? Authorizing Provider  amLODipine (NORVASC) 5 MG tablet Take by mouth. 12/24/17   [provider]  Biotin w/ Vitamins C & E (HAIR SKIN & NAILS GUMMIES PO) Take 2 tablets by mouth daily.    [provider]  citalopram (CELEXA) 40 MG tablet Take 1 tablet (40 mg total) by mouth daily. Take in the morning 01/13/18 05/08/19  Aundra Dubin, MD  ferrous sulfate 325 (65 FE) MG tablet Take 325 mg  by mouth daily. 02/05/19   [provider]  hydrOXYzine (VISTARIL) 25 MG capsule Take 25 mg by mouth 3 (three) times daily.  05/05/19   [provider]  ibuprofen (ADVIL,MOTRIN) 200 MG tablet Take 200 mg by mouth every 6 (six) hours as needed (For pain.).    [provider]  lisinopril (ZESTRIL) 10 MG tablet Take 10 mg by mouth daily. 05/04/19   [provider]  meloxicam (MOBIC) 15 MG tablet Take 15 mg by mouth 3 (three) times daily with meals. 04/12/19   [provider]  Multiple Vitamins-Minerals (ADULT GUMMY PO) Take 2 tablets by mouth daily.    [provider]  traZODone (DESYREL) 100 MG tablet Take 1 tablet (100 mg total) by mouth at bedtime as needed for sleep. 01/13/18   Eksir, Richard Miu, MD  TRINTELLIX 5 MG TABS tablet Take 5 mg by mouth daily. 04/25/19   [provider]    Family History Family History  Problem Relation Age of Onset  . Hypertension Mother   . Depression Maternal Grandmother     Social History Social History   Tobacco Use  . Smoking status: Never Smoker  . Smokeless tobacco: Never Used  Substance Use Topics  . Alcohol use: Yes    Comment: social   . Drug use: No     Allergies   Vicodin [hydrocodone-acetaminophen]   Review  of Systems Review of Systems  All other systems reviewed and are negative.    Physical Exam Updated Vital Signs BP (!) 140/101   Pulse 90   Temp 99.1 F (37.3 C) (Oral)   LMP 04/24/2019   SpO2 100%   Physical Exam Vitals signs and nursing note reviewed.  Constitutional:      Appearance: She is well-developed.  HENT:     Head: Normocephalic.     Right Ear: Tympanic membrane normal.     Nose: Nose normal.     Mouth/Throat:     Mouth: Mucous membranes are moist.  Eyes:     Pupils: Pupils are equal, round, and reactive to light.  Neck:     Musculoskeletal: Normal range of motion.  Cardiovascular:     Rate and Rhythm: Regular rhythm.  Pulmonary:      Effort: Pulmonary effort is normal.  Abdominal:     General: There is no distension.  Musculoskeletal: Normal range of motion.  Skin:    General: Skin is warm.  Neurological:     General: No focal deficit present.     Mental Status: She is alert and oriented to person, place, and time.     Cranial Nerves: No cranial nerve deficit.     Sensory: No sensory deficit.  Psychiatric:        Mood and Affect: Mood normal.      ED Treatments / Results  Labs (all labs ordered are listed, but only abnormal results are displayed) Labs Reviewed  COMPREHENSIVE METABOLIC PANEL - Abnormal; Notable for the following components:      Result Value   BUN 5 (*)    All other components within normal limits    EKG None  Radiology No results found.  Procedures Procedures (including critical care time)  Medications Ordered in ED Medications - No data to display An After Visit Summary was printed and given to the patient.   Initial Impression / Assessment and Plan / ED Course  I have reviewed the triage vital signs and the nursing notes.  Pertinent labs & imaging results that were available during my care of the patient were reviewed by me and considered in my medical decision making (see chart for details).        Pt had a ct 3 days ago.  Pt offered MRi.  Pt does not want to have due to anxiety.   Pt would perfer referral to Neurology   Final Clinical Impressions(s) / ED Diagnoses   Final diagnoses:  Facial tingling sensation    ED Discharge Orders    None       Elson Areas, [place] 05/08/19 1714    Terald Sleeper, MD 05/08/19 1815

## 2019-05-08 NOTE — ED Notes (Signed)
Patient verbalizes understanding of discharge instructions. Opportunity for questioning and answers were provided. Armband removed by staff, pt discharged from ED.  

## 2019-05-08 NOTE — ED Triage Notes (Signed)
Pt back to the ED today with c/o right ear pain and some some right side numbness to face ,

## 2019-05-08 NOTE — Discharge Instructions (Addendum)
Schedule to see the neurologist for testing  Continue your current medications

## 2019-05-12 ENCOUNTER — Emergency Department (HOSPITAL_COMMUNITY)
Admission: EM | Admit: 2019-05-12 | Discharge: 2019-05-12 | Disposition: A | Discharge status: Home / Self Care | Payer: 59 | Attending: Emergency Medicine | Admitting: Emergency Medicine

## 2019-05-12 ENCOUNTER — Other Ambulatory Visit: Payer: Self-pay

## 2019-05-12 ENCOUNTER — Encounter (HOSPITAL_COMMUNITY): Payer: Self-pay | Admitting: Emergency Medicine

## 2019-05-12 DIAGNOSIS — Z5321 Procedure and treatment not carried out due to patient leaving prior to being seen by health care provider: Secondary | ICD-10-CM | POA: Insufficient documentation

## 2019-05-12 DIAGNOSIS — G43909 Migraine, unspecified, not intractable, without status migrainosus: Secondary | ICD-10-CM | POA: Insufficient documentation

## 2019-05-12 NOTE — ED Notes (Signed)
Pt states that she is going to leave. States has Neuro appt on Monday, 05/17/2019. Started a new BP med 2 days ago and  will continue taking and follow up with her family dr in the a.m. Pt recommended to stay but declined.

## 2019-05-12 NOTE — ED Triage Notes (Signed)
Pt arrives via EMS with reports of migraine for 1 week. Pt seen at [facility] and got a CT but unable to get MRI due to claustrophobia.

## 2019-05-13 ENCOUNTER — Encounter (HOSPITAL_BASED_OUTPATIENT_CLINIC_OR_DEPARTMENT_OTHER): Payer: Self-pay | Admitting: *Deleted

## 2019-05-13 ENCOUNTER — Emergency Department (HOSPITAL_BASED_OUTPATIENT_CLINIC_OR_DEPARTMENT_OTHER)
Admission: EM | Admit: 2019-05-13 | Discharge: 2019-05-13 | Disposition: A | Discharge status: Home / Self Care | Payer: 59 | Attending: Emergency Medicine | Admitting: Emergency Medicine

## 2019-05-13 ENCOUNTER — Other Ambulatory Visit: Payer: Self-pay

## 2019-05-13 DIAGNOSIS — R519 Headache, unspecified: Secondary | ICD-10-CM | POA: Diagnosis present

## 2019-05-13 DIAGNOSIS — R202 Paresthesia of skin: Secondary | ICD-10-CM | POA: Diagnosis not present

## 2019-05-13 DIAGNOSIS — I10 Essential (primary) hypertension: Secondary | ICD-10-CM | POA: Diagnosis not present

## 2019-05-13 DIAGNOSIS — Z79899 Other long term (current) drug therapy: Secondary | ICD-10-CM | POA: Insufficient documentation

## 2019-05-13 MED ORDER — KETOROLAC TROMETHAMINE 15 MG/ML IJ SOLN
15.0000 mg | Freq: Once | INTRAMUSCULAR | Status: AC
  Administered 2019-05-13: 20:00:00 15 mg via INTRAMUSCULAR
  Filled 2019-05-13: qty 1

## 2019-05-13 MED ORDER — METOCLOPRAMIDE HCL 5 MG/ML IJ SOLN
10.0000 mg | Freq: Once | INTRAMUSCULAR | Status: AC
  Administered 2019-05-13: 10 mg via INTRAMUSCULAR
  Filled 2019-05-13: qty 2

## 2019-05-13 NOTE — ED Triage Notes (Signed)
Pt c/o h/a and facial tingling x 6 days , seen in ED for same x 3 days ago, NEuro appt monday

## 2019-05-13 NOTE — ED Notes (Signed)
Family at bedside. 

## 2019-05-13 NOTE — ED Notes (Signed)
Pt reports headache for past week, has been seen multiple times, pmd making adjustment in blood pressure medications.  Pt also taking tylenol without relief.  Denies vision difficulties. Denies nausea.

## 2019-05-15 ENCOUNTER — Emergency Department (HOSPITAL_COMMUNITY)
Admission: EM | Admit: 2019-05-15 | Discharge: 2019-05-15 | Disposition: A | Discharge status: Home / Self Care | Payer: 59 | Attending: Emergency Medicine | Admitting: Emergency Medicine

## 2019-05-15 ENCOUNTER — Other Ambulatory Visit: Payer: Self-pay

## 2019-05-15 ENCOUNTER — Encounter (HOSPITAL_COMMUNITY): Payer: Self-pay

## 2019-05-15 DIAGNOSIS — Y9389 Activity, other specified: Secondary | ICD-10-CM | POA: Diagnosis not present

## 2019-05-15 DIAGNOSIS — W1839XA Other fall on same level, initial encounter: Secondary | ICD-10-CM | POA: Insufficient documentation

## 2019-05-15 DIAGNOSIS — Y929 Unspecified place or not applicable: Secondary | ICD-10-CM | POA: Diagnosis not present

## 2019-05-15 DIAGNOSIS — S0181XA Laceration without foreign body of other part of head, initial encounter: Secondary | ICD-10-CM | POA: Insufficient documentation

## 2019-05-15 DIAGNOSIS — Y999 Unspecified external cause status: Secondary | ICD-10-CM | POA: Diagnosis not present

## 2019-05-15 DIAGNOSIS — Z79899 Other long term (current) drug therapy: Secondary | ICD-10-CM | POA: Diagnosis not present

## 2019-05-15 DIAGNOSIS — I10 Essential (primary) hypertension: Secondary | ICD-10-CM | POA: Diagnosis not present

## 2019-05-15 DIAGNOSIS — W19XXXA Unspecified fall, initial encounter: Secondary | ICD-10-CM

## 2019-05-15 LAB — I-STAT CHEM 8, ED
BUN: 11 mg/dL (ref 6–20)
Calcium, Ion: 1.16 mmol/L (ref 1.15–1.40)
Chloride: 98 mmol/L (ref 98–111)
Creatinine, Ser: 0.7 mg/dL (ref 0.44–1.00)
Glucose, Bld: 96 mg/dL (ref 70–99)
HCT: 44 % (ref 36.0–46.0)
Hemoglobin: 15 g/dL (ref 12.0–15.0)
Potassium: 3.3 mmol/L — ABNORMAL LOW (ref 3.5–5.1)
Sodium: 137 mmol/L (ref 135–145)
TCO2: 24 mmol/L (ref 22–32)

## 2019-05-15 LAB — PREGNANCY, URINE: Preg Test, Ur: NEGATIVE

## 2019-05-15 NOTE — Discharge Instructions (Signed)
Please attend your neurology appointment as scheduled in 2 days.  Follow-up with your PCP regarding your ongoing blood pressure medication management.  Continue to take once daily BP readings to monitor results.  Please read the attachment on wound care and be sure to keep wound moist and clean.  Your Steri-Strips will fall off spontaneously within 2 weeks.  Do not pull on the strips or hasten that timeline.  Do not put Neosporin or other topical antibiotics over wound.  Return to the ED or seek medical attention should she develop any new or worsening, new onset dizziness, uncontrolled nausea and vomiting, visual changes or other neurologic deficits, or any other new or worsening symptoms.

## 2019-05-15 NOTE — ED Provider Notes (Addendum)
[facility] DEPT Provider Note   CSN: [id number] Arrival date & time: 05/15/19  0234     History   Chief Complaint Chief Complaint  Patient presents with  . Fall    HPI Jeanette Santos is a 38 y.o. female with past medical history significant for hypertension presents to the ED from home subsequent to a fall.  Her PCP has been making adjustments to her antihypertensive medications as recently 7 days ago.  Evidently she was taking her blood pressure last night approximately 915 in the cuff got particularly tight and uncomfortable on her forearm, which provoked a feeling of uneasiness and dizziness.  She proceeded to stand up and "walk it off" when she fell and landed on her chin.  Her mother who accompanied her in the ED today observe the fall and states that there was no loss of consciousness or abnormal muscle movements.  She denies any postictal confusion, incontinence, or tongue biting.  She sustained a 2 cm chin laceration which has since spontaneously achieved hemostasis.  She denies any recent illness, fevers, chills, headache, current dizziness, nausea, vomiting, abdominal discomfort, shortness of breath, chest pain, or any other symptoms.      HPI  Past Medical History:  Diagnosis Date  . Anemia   . Anxiety   . Arnold-Chiari malformation ([place]) 02/11/2014  . Depression   . Hypertension   . Obesity     Patient Active Problem List   Diagnosis Date Noted  . Depression, major, recurrent, moderate ([place]) 08/11/2017    Class: Acute  . Arnold-Chiari malformation ([place]) 02/11/2014  . GERD (gastroesophageal reflux disease) 03/26/2013  . IRREGULAR MENSES 10/09/2007  . HYPERTENSION, BENIGN ESSENTIAL 04/07/2007  . ASCUS PAP 04/07/2007  . OBESITY, NOS 09/18/2006    Past Surgical History:  Procedure Laterality Date  . NONE       OB History   No obstetric history on file.      Home Medications    Prior to Admission medications    Medication Sig Start Date End Date Taking? Authorizing Provider  acetaminophen (TYLENOL) 500 MG tablet Take 500-1,000 mg by mouth every 6 (six) hours as needed for mild pain or headache.    [provider]  amLODipine (NORVASC) 10 MG tablet Take 10 mg by mouth daily.    [provider]  Biotin w/ Vitamins C & E (HAIR SKIN & NAILS GUMMIES PO) Take 2 tablets by mouth daily.    [provider]  citalopram (CELEXA) 40 MG tablet Take 1 tablet (40 mg total) by mouth daily. Take in the morning Patient not taking: Reported on 05/08/2019 01/13/18 05/08/19  Aundra Dubin, MD  ferrous sulfate 325 (65 FE) MG tablet Take 325 mg by mouth daily. 02/05/19   [provider]  hydrOXYzine (VISTARIL) 25 MG capsule Take 25 mg by mouth 3 (three) times daily.  05/05/19   [provider]  ibuprofen (ADVIL,MOTRIN) 200 MG tablet Take 200-400 mg by mouth every 6 (six) hours as needed (For pain.).     [provider]  lisinopril (ZESTRIL) 10 MG tablet Take 10 mg by mouth daily. 05/04/19   [provider]  meloxicam (MOBIC) 15 MG tablet Take 15 mg by mouth 3 (three) times daily with meals. 04/12/19   [provider]  Multiple Vitamins-Minerals (ADULT GUMMY PO) Take 2 tablets by mouth daily. CHEW    [provider]  traZODone (DESYREL) 100 MG tablet Take 1 tablet (100 mg total) by  mouth at bedtime as needed for sleep. 01/13/18   Eksir, Bo McclintockAlexander Arya, MD  TRINTELLIX 5 MG TABS tablet Take 5 mg by mouth daily. 04/25/19   [provider]  vitamin C (ASCORBIC ACID) 500 MG tablet Take 500-1,000 mg by mouth daily.    [provider]    Family History Family History  Problem Relation Age of Onset  . Hypertension Mother   . Depression Maternal Grandmother     Social History Social History   Tobacco Use  . Smoking status: Never Smoker  . Smokeless tobacco: Never Used  Substance Use Topics  . Alcohol use: Yes    Comment:  social   . Drug use: No     Allergies   Lisinopril and Vicodin [hydrocodone-acetaminophen]   Review of Systems Review of Systems  All other systems reviewed and are negative.    Physical Exam Updated Vital Signs BP (!) 139/107   Pulse (!) 110   Temp 98.4 F (36.9 C) (Oral)   Resp 17   LMP 04/24/2019   SpO2 99%   Physical Exam Vitals signs and nursing note reviewed. Exam conducted with a chaperone present.  Constitutional:      Appearance: Normal appearance.  HENT:     Head: Normocephalic.     Comments: 2 cm linear, well-approximated laceration that has spontaneously achieved hemostasis.  Wound is clean.  No surrounding erythema or swelling. Eyes:     General: No scleral icterus.    Extraocular Movements: Extraocular movements intact.     Conjunctiva/sclera: Conjunctivae normal.     Pupils: Pupils are equal, round, and reactive to light.  Neck:     Musculoskeletal: Normal range of motion. No neck rigidity.  Cardiovascular:     Rate and Rhythm: Regular rhythm.     Pulses: Normal pulses.     Heart sounds: Normal heart sounds.  Pulmonary:     Effort: Pulmonary effort is normal. No respiratory distress.     Breath sounds: Normal breath sounds.  Abdominal:     General: Abdomen is flat. There is no distension.     Palpations: Abdomen is soft.     Tenderness: There is no guarding.  Skin:    General: Skin is dry.  Neurological:     Mental Status: She is alert.     GCS: GCS eye subscore is 4. GCS verbal subscore is 5. GCS motor subscore is 6.     Comments: CN II through XII grossly intact.  Sensation intact throughout.  Patient is oriented x3.  Negative Romberg and cerebellar exams.  Ambulating without difficulty.  Psychiatric:        Mood and Affect: Mood normal.        Behavior: Behavior normal.        Thought Content: Thought content normal.      ED Treatments / Results  Labs (all labs ordered are listed, but only abnormal results are displayed) Labs  Reviewed  I-STAT CHEM 8, ED - Abnormal; Notable for the following components:      Result Value   Potassium 3.3 (*)    All other components within normal limits  PREGNANCY, URINE    EKG EKG Interpretation  Date/Time:  Saturday May 15 2019 11:25:02 EDT Ventricular Rate:  102 PR Interval:    QRS Duration: 96 QT Interval:  358 QTC Calculation: 467 R Axis:   45 Text Interpretation:  Sinus tachycardia Confirmed by Virgina NorfolkAdam, Curatolo [phone]([id number]) on 05/15/2019 11:38:16 AM   Radiology No results found.  Procedures Procedures (including critical care time)  Medications Ordered in ED Medications - No data to display   Initial Impression / Assessment and Plan / ED Course  I have reviewed the triage vital signs and the nursing notes.  Pertinent labs & imaging results that were available during my care of the patient were reviewed by me and considered in my medical decision making (see chart for details).         Reviewed head CT without contrast that was obtained on 05/05/2019 which revealed no acute or concerning findings.  While patient sustained laceration to chin, this is not a high impact injury.  She denies any loss of consciousness, confusion, nausea or vomiting, or current headache.  CT head is not warranted as I am not concerned for an intracranial hemorrhage. Patient has an appointment scheduled with neurology in 2 days and will follow up with them to determine if MRI is warranted given her history.  Patient reports prodromal symptoms prior to her fall.  There was a clear trigger as she reports feeling uneasy and "flushed" from taking her blood pressure prior to standing up.  She has been hydrating well.  Orthostatic vital signs were obtained and are within normal limits.  I-STAT and urine pregnancy were also unremarkable.  Her EKG was also reassuring and showed no evidence of LVH concerning for HOCM.  She also denies any history of exertional syncope.  This was likely a  vasovagal episode that precipitated her acute onset dizziness and fall.  Her 2 cm linear laceration on her chin is approximately 14 hours old and has already achieved hemostasis spontaneously.  It is well approximated and did not begin to rebleed with wound exploration.  Will apply Steri-Strips and provide wound care instructions in her discharge papers.  Continue ibuprofen or Tylenol for discomfort, as needed.  Instructed patient to return to the ED or seek medical attention should she develop any new or worsening, new onset dizziness, uncontrolled nausea and vomiting, visual changes or other neurologic deficits, or any other new or worsening symptoms.  All of the evaluation and work-up results were discussed with the patient and any family at bedside. They were provided opportunity to ask any additional questions and have none at this time. They have expressed understanding of verbal discharge instructions as well as return precautions and are agreeable to the plan.    Final Clinical Impressions(s) / ED Diagnoses   Final diagnoses:  Fall, initial encounter  Chin laceration, initial encounter    ED Discharge Orders    None       Lorelee New, PA-C 05/15/19 1234    Lorelee New, PA-C 05/15/19 1235    Curatolo, Adam, DO 05/15/19 1456

## 2019-05-15 NOTE — ED Triage Notes (Signed)
Pt coming from home c/o laceration on the chin after falling at her home. Pt stated she felt really dizzy due to bp. Pt has knot on the right side of forehead from fall. No LOC.

## 2019-05-17 ENCOUNTER — Encounter: Payer: Self-pay | Admitting: Neurology

## 2019-05-17 ENCOUNTER — Ambulatory Visit (INDEPENDENT_AMBULATORY_CARE_PROVIDER_SITE_OTHER): Payer: 59 | Admitting: Neurology

## 2019-05-17 ENCOUNTER — Other Ambulatory Visit: Payer: Self-pay

## 2019-05-17 VITALS — BP 144/100 | HR 132 | Ht 63.0 in | Wt 293.0 lb

## 2019-05-17 DIAGNOSIS — Q0700 Arnold-Chiari syndrome without spina bifida or hydrocephalus: Secondary | ICD-10-CM

## 2019-05-17 DIAGNOSIS — G459 Transient cerebral ischemic attack, unspecified: Secondary | ICD-10-CM

## 2019-05-17 NOTE — Progress Notes (Signed)
Reason for visit: Transient numbness, headache  Referring physician: [place]  Jeanette Santos is a 38 y.o. female  History of present illness:  [name] is a 38 year old right-handed black female with a history of obesity and mild Arnold-Chiari malformation.  The patient recently had issues with high blood pressure, she was started on medication to include lisinopril recently by her primary care doctor.  The lisinopril resulted in swelling and she was recently switched to hydrochlorothiazide within the last week.  The patient has visited the emergency room on 5 occasions since May 05, 2019.  The last visit was on 15 May 2019.  The patient has begun having problems with headaches that will come up in the back of the head on the right and go to the front of the head.  The patient may have photophobia and phonophobia with the headache.  She denies any nausea or vomiting.  She may note some numbness that begins on the right face and then spreads to the right arm and to the right foot.  She denies any weakness or vision changes or changes in speech.  She has no swallowing issues but she may have some slight dizziness.  She reports no balance issues or falls, she denies issues controlling the bowels or the bladder.  She denies any significant history of similar headaches in the past.  She has undergone a CT scan of the brain that was unremarkable, she comes to this office for an evaluation.  She does report a relatively high degree of anxiety, recently she was given a small prescription for Xanax from her primary doctor.  Past Medical History:  Diagnosis Date  . Anemia   . Anxiety   . Arnold-Chiari malformation ([place]) 02/11/2014  . Depression   . Hypertension   . Obesity     Past Surgical History:  Procedure Laterality Date  . NONE      Family History  Problem Relation Age of Onset  . Hypertension Mother   . Depression Maternal Grandmother     Social history:  reports that she  has never smoked. She has never used smokeless tobacco. She reports current alcohol use. She reports that she does not use drugs.  Medications:  Prior to Admission medications   Medication Sig Start Date End Date Taking? Authorizing Provider  acetaminophen (TYLENOL) 500 MG tablet Take 500-1,000 mg by mouth every 6 (six) hours as needed for mild pain or headache.   Yes [provider]  amLODipine (NORVASC) 10 MG tablet Take 10 mg by mouth daily.   Yes [provider]  Biotin w/ Vitamins C & E (HAIR SKIN & NAILS GUMMIES PO) Take 2 tablets by mouth daily.   Yes [provider]  ferrous sulfate 325 (65 FE) MG tablet Take 325 mg by mouth daily. 02/05/19  Yes [provider]  hydrochlorothiazide (HYDRODIURIL) 25 MG tablet Take 25 mg by mouth daily.   Yes [provider]  hydrOXYzine (VISTARIL) 25 MG capsule Take 25 mg by mouth 3 (three) times daily.  05/05/19  Yes [provider]  ibuprofen (ADVIL,MOTRIN) 200 MG tablet Take 200-400 mg by mouth every 6 (six) hours as needed (For pain.).    Yes [provider]  meloxicam (MOBIC) 15 MG tablet Take 15 mg by mouth 3 (three) times daily with meals. 04/12/19  Yes [provider]  Multiple Vitamins-Minerals (ADULT GUMMY PO) Take 2 tablets by mouth daily. CHEW   Yes [provider]  traZODone (DESYREL) 100 MG tablet Take 1 tablet (100 mg total) by mouth at bedtime as needed for sleep. 01/13/18  Yes Eksir, Bo Mcclintock, MD  vitamin C (ASCORBIC ACID) 500 MG tablet Take 500-1,000 mg by mouth daily.   Yes [provider]  citalopram (CELEXA) 40 MG tablet Take 1 tablet (40 mg total) by mouth daily. Take in the morning Patient not taking: Reported on 05/08/2019 01/13/18 05/08/19  Burnard Leigh, MD      Allergies  Allergen Reactions  . Lisinopril Swelling  . Vicodin [Hydrocodone-Acetaminophen] Itching    ROS:  Out of a complete 14 system review of symptoms, the  patient complains only of the following symptoms, and all other reviewed systems are negative.  Headache Numbness Anxiety  Blood pressure (!) 144/100, pulse (!) 132, height 5\' 3"  (1.6 m), weight 293 lb (132.9 kg), last menstrual period 04/24/2019.  Physical Exam  General: The patient is alert and cooperative at the time of the examination.  The patient is markedly obese.  Eyes: Pupils are equal, round, and reactive to light. Discs are flat bilaterally.  Neck: The neck is supple, no carotid bruits are noted.  Respiratory: The respiratory examination is clear.  Cardiovascular: The cardiovascular examination reveals a regular rate and rhythm, no obvious murmurs or rubs are noted.  Skin: Extremities are without significant edema.  Neurologic Exam  Mental status: The patient is alert and oriented x 3 at the time of the examination. The patient has apparent normal recent and remote memory, with an apparently normal attention span and concentration ability.  Cranial nerves: Facial symmetry is present. There is good sensation of the face to pinprick and soft touch bilaterally. The strength of the facial muscles and the muscles to head turning and shoulder shrug are normal bilaterally. Speech is well enunciated, no aphasia or dysarthria is noted. Extraocular movements are full. Visual fields are full. The tongue is midline, and the patient has symmetric elevation of the soft palate. No obvious hearing deficits are noted.  Motor: The motor testing reveals 5 over 5 strength of all 4 extremities. Good symmetric motor tone is noted throughout.  Sensory: Sensory testing is intact to pinprick, soft touch, vibration sensation, and position sense on all 4 extremities. No evidence of extinction is noted.  Coordination: Cerebellar testing reveals good finger-nose-finger and heel-to-shin bilaterally.  Gait and station: Gait is normal. Tandem gait is normal. Romberg is negative. No drift is seen.   Reflexes: Deep tendon reflexes are symmetric and normal bilaterally. Toes are downgoing bilaterally.   CT head 05/05/19:  IMPRESSION: Negative non contrasted CT appearance of the brain  * CT scan images were reviewed online. I agree with the written report.    Assessment/Plan:  1.  Transient right-sided numbness  2.  Headache possible migraine  3.  Hypertension  4.  Anxiety  5.  History of mild Arnold-Chiari malformation  The episodes described by the patient could be related to vasospasm associated with hypertension.  The events of numbness last about 30 minutes and then clear.  Migraine headache may also cause similar problems, the patient could potentially be having symptoms from Arnold-Chiari malformation but this appears to be relatively mild after a work-up 5 years ago.  The patient will go on low-dose aspirin, 81 mg.  She will be set up for MRI of the brain and a carotid Doppler study.  If the headaches continue, she will be started on a beta-blocker to help her anxiety and to help the  headache.  She will follow-up in 2 months.  Marlan Palau. Keith Kali Ambler MD 05/17/2019 7:30 AM  [facility] [address] [place], [place] [id number]  Phone [phone] Fax [phone]

## 2019-05-17 NOTE — ED Provider Notes (Signed)
[facility] EMERGENCY DEPARTMENT Provider Note   CSN: [id number] Arrival date & time: 05/13/19  1827     History   Chief Complaint Chief Complaint  Patient presents with  . Headache    HPI Jeanette Santos is a 38 y.o. female.     HPI   38 year old female with headache.  Onset about a week ago.  She cannot remember she do not she first noticed it.  She has been seen multiple times for the same complaints.  Blood pressure medications are currently being changed.  She is tried Tylenol without much improvement.  No fevers or chills.  No nausea or vomiting.  No visual complaints.  She has had some facial tingling and also tingling in bilateral hands and feet.  Past Medical History:  Diagnosis Date  . Anemia   . Anxiety   . Arnold-Chiari malformation ([facility]) 02/11/2014  . Depression   . Hypertension   . Obesity     Patient Active Problem List   Diagnosis Date Noted  . Depression, major, recurrent, moderate ([facility]) 08/11/2017    Class: Acute  . Arnold-Chiari malformation ([facility]) 02/11/2014  . GERD (gastroesophageal reflux disease) 03/26/2013  . IRREGULAR MENSES 10/09/2007  . HYPERTENSION, BENIGN ESSENTIAL 04/07/2007  . ASCUS PAP 04/07/2007  . OBESITY, NOS 09/18/2006    Past Surgical History:  Procedure Laterality Date  . NONE       OB History   No obstetric history on file.      Home Medications    Prior to Admission medications   Medication Sig Start Date End Date Taking? Authorizing Provider  acetaminophen (TYLENOL) 500 MG tablet Take 500-1,000 mg by mouth every 6 (six) hours as needed for mild pain or headache.    [provider]  amLODipine (NORVASC) 10 MG tablet Take 10 mg by mouth daily.    [provider]  Biotin w/ Vitamins C & E (HAIR SKIN & NAILS GUMMIES PO) Take 2 tablets by mouth daily.    [provider]  citalopram (CELEXA) 40 MG tablet Take 1 tablet (40 mg total) by mouth daily. Take in the morning Patient not  taking: Reported on 05/08/2019 01/13/18 05/08/19  Burnard LeighEksir, Alexander Arya, MD  ferrous sulfate 325 (65 FE) MG tablet Take 325 mg by mouth daily. 02/05/19   [provider]  hydrochlorothiazide (HYDRODIURIL) 25 MG tablet Take 25 mg by mouth daily.    [provider]  hydrOXYzine (VISTARIL) 25 MG capsule Take 25 mg by mouth 3 (three) times daily.  05/05/19   [provider]  ibuprofen (ADVIL,MOTRIN) 200 MG tablet Take 200-400 mg by mouth every 6 (six) hours as needed (For pain.).     [provider]  meloxicam (MOBIC) 15 MG tablet Take 15 mg by mouth 3 (three) times daily with meals. 04/12/19   [provider]  Multiple Vitamins-Minerals (ADULT GUMMY PO) Take 2 tablets by mouth daily. CHEW    [provider]  traZODone (DESYREL) 100 MG tablet Take 1 tablet (100 mg total) by mouth at bedtime as needed for sleep. 01/13/18   Eksir, Bo McclintockAlexander Arya, MD  vitamin C (ASCORBIC ACID) 500 MG tablet Take 500-1,000 mg by mouth daily.    [provider]    Family History Family History  Problem Relation Age of Onset  . Hypertension Mother   . Depression Maternal Grandmother     Social History Social History   Tobacco Use  . Smoking status: Never Smoker  . Smokeless  tobacco: Never Used  Substance Use Topics  . Alcohol use: Yes    Comment: social   . Drug use: No     Allergies   Lisinopril and Vicodin [hydrocodone-acetaminophen]   Review of Systems Review of Systems  All systems reviewed and negative, other than as noted in HPI.  Physical Exam Updated Vital Signs BP 121/83 (BP Location: Right Arm)   Pulse 87   Temp 98.1 F (36.7 C)   Resp 18   Ht 5\' 3"  (1.6 m)   Wt (!) 136.5 kg   LMP 04/24/2019   SpO2 100%   BMI 53.32 kg/m   Physical Exam Vitals signs and nursing note reviewed.  Constitutional:      General: She is not in acute distress.    Appearance: She is well-developed.  HENT:     Head: Normocephalic and  atraumatic.  Eyes:     General:        Right eye: No discharge.        Left eye: No discharge.     Conjunctiva/sclera: Conjunctivae normal.  Neck:     Musculoskeletal: Neck supple.  Cardiovascular:     Rate and Rhythm: Normal rate and regular rhythm.     Heart sounds: Normal heart sounds. No murmur. No friction rub. No gallop.   Pulmonary:     Effort: Pulmonary effort is normal. No respiratory distress.     Breath sounds: Normal breath sounds.  Abdominal:     General: There is no distension.     Palpations: Abdomen is soft.     Tenderness: There is no abdominal tenderness.  Musculoskeletal:        General: No tenderness.  Skin:    General: Skin is warm and dry.  Neurological:     Mental Status: She is alert and oriented to person, place, and time.     Cranial Nerves: No cranial nerve deficit.     Motor: No weakness.     Coordination: Coordination normal.  Psychiatric:        Behavior: Behavior normal.        Thought Content: Thought content normal.      ED Treatments / Results  Labs (all labs ordered are listed, but only abnormal results are displayed) Labs Reviewed - No data to display  EKG None  Radiology No results found.  Procedures Procedures (including critical care time)  Medications Ordered in ED Medications  ketorolac (TORADOL) 15 MG/ML injection 15 mg (15 mg Intramuscular Given 05/13/19 2023)  metoCLOPramide (REGLAN) injection 10 mg (10 mg Intramuscular Given 05/13/19 2023)     Initial Impression / Assessment and Plan / ED Course  I have reviewed the triage vital signs and the nursing notes.  Pertinent labs & imaging results that were available during my care of the patient were reviewed by me and considered in my medical decision making (see chart for details).        38 year old female with numerous complaints.  Headache.  Several evaluations at this point.  I think the next step would be neurology evaluation.  Her neuro exam here is  nonfocal.  She is complaining of neuropathic type symptoms in face, bilateral hands and feet.  I am unclear as to exact etiology but I doubt emergent process.  Plan continue symptomatic treatment.  Return precautions discussed.  Outpatient follow-up otherwise.  Final Clinical Impressions(s) / ED Diagnoses   Final diagnoses:  Nonintractable headache, unspecified chronicity pattern, unspecified headache type    ED Discharge  Orders    None       Virgel Manifold, MD 05/17/19 1135

## 2019-05-18 ENCOUNTER — Telehealth: Payer: Self-pay | Admitting: Neurology

## 2019-05-18 NOTE — Telephone Encounter (Signed)
LVM for pt to call back about scheduling mri  [employer] auth: [employer] via [employer] website 

## 2019-05-18 NOTE — Telephone Encounter (Signed)
Patient returned my call she is scheduled at [facility] for 05/19/19 no to the covid questions.

## 2019-05-19 ENCOUNTER — Other Ambulatory Visit: Payer: Self-pay

## 2019-05-19 ENCOUNTER — Ambulatory Visit: Payer: 59

## 2019-05-19 DIAGNOSIS — G459 Transient cerebral ischemic attack, unspecified: Secondary | ICD-10-CM

## 2019-05-20 ENCOUNTER — Telehealth: Payer: Self-pay | Admitting: Neurology

## 2019-05-20 NOTE — Telephone Encounter (Signed)
  I called the patient.  MRI of the brain was unremarkable, the patient will contact our office if she is having ongoing issues with headaches or new or changing symptoms.  Carotid Doppler study is pending.   MRI brain 05/20/19:  IMPRESSION:   Unremarkable MRI brain (without). No acute findings.

## 2019-05-24 NOTE — Telephone Encounter (Signed)
Pt returned call and I was able to review recommendations. Pt was agreeable. Pt will call back if h/a worsen/ new sx appear.

## 2019-05-27 ENCOUNTER — Other Ambulatory Visit: Payer: Self-pay

## 2019-05-27 ENCOUNTER — Encounter (HOSPITAL_COMMUNITY): Payer: Self-pay | Admitting: Emergency Medicine

## 2019-05-27 ENCOUNTER — Ambulatory Visit (HOSPITAL_COMMUNITY)
Admission: EM | Admit: 2019-05-27 | Discharge: 2019-05-27 | Disposition: A | Discharge status: Home / Self Care | Payer: 59 | Attending: Family Medicine | Admitting: Family Medicine

## 2019-05-27 DIAGNOSIS — R0789 Other chest pain: Secondary | ICD-10-CM

## 2019-05-27 DIAGNOSIS — K219 Gastro-esophageal reflux disease without esophagitis: Secondary | ICD-10-CM | POA: Diagnosis not present

## 2019-05-27 DIAGNOSIS — R000 Tachycardia, unspecified: Secondary | ICD-10-CM | POA: Diagnosis not present

## 2019-05-27 HISTORY — DX: Migraine, unspecified, not intractable, without status migrainosus: G43.909

## 2019-05-27 MED ORDER — LIDOCAINE VISCOUS HCL 2 % MT SOLN
OROMUCOSAL | Status: AC
  Filled 2019-05-27: qty 15

## 2019-05-27 MED ORDER — LIDOCAINE VISCOUS HCL 2 % MT SOLN
15.0000 mL | Freq: Once | OROMUCOSAL | Status: AC
  Administered 2019-05-27: 15 mL via ORAL

## 2019-05-27 MED ORDER — ALUM & MAG HYDROXIDE-SIMETH 200-200-20 MG/5ML PO SUSP
30.0000 mL | Freq: Once | ORAL | Status: AC
  Administered 2019-05-27: 30 mL via ORAL

## 2019-05-27 MED ORDER — ALUM & MAG HYDROXIDE-SIMETH 200-200-20 MG/5ML PO SUSP
ORAL | Status: AC
  Filled 2019-05-27: qty 30

## 2019-05-27 NOTE — ED Triage Notes (Signed)
Spoke with pt in the lobby then took pt's vital signs and spoke with Dr. Meda Coffee.  Pt will stay here for further evaluation.  Pt has had a four day history of bad headaches and was seen in an UC and two tele health visits.  Pt tried Imitrex and Excedrin for the headaches and was also given Promethazine for her nausea.  Pt woke up today feeling jittery and like she could feel her heart beating in her head, then she developed pain in the center of her chest, stating "it feels like something is stuck in there and I have to burp."  Pt states the pain radiates to her upper back with burning in her back.  She denies any more headache and no more nausea.  She denies vision changes, numbness or tingling in her extremities and no SOB.  Pt is A&O and Dr. Meda Coffee is aware of her vital signs.

## 2019-05-27 NOTE — Discharge Instructions (Signed)
Please use maalox as needed for reflux If becoming recurrent try over the counter omeprazole/prilosec 20 mg daily for 2 weeks Follow up with primary care as planned  If symptoms changing or worsening follow up

## 2019-05-27 NOTE — ED Provider Notes (Signed)
[facility]    CSN: [id number] Arrival date & time: 05/27/19  1353      History   Chief Complaint Chief Complaint  Patient presents with  . Chest Pain  . Back Pain    HPI Jeanette Santos is a 38 y.o. female history of hypertension, obesity, migraines, Chiari malformation presenting today for evaluation of chest tightness.  Patient states that this morning she woke up feeling slightly jittery as well as feeling a tightness in her chest with an associated burning sensation to her upper back.  It feels as if she needs to burp as if there is something stuck in her upper chest.  Denies tearing or ripping sensation.  Denies chest pain.  She states that since October 12 she has had multiple new medical issues, about and feels as if her blood pressure has been uncontrolled.  She has had recurrent headaches, today she finally has relief of headaches and does not have any nausea.  She did take Phenergan for the first time last night and was unsure if this could have caused her chest discomfort today.  She denies any history of diabetes.  Denies tobacco use.  Denies recent cough or shortness of breath.  She has tried drinking water, ginger ale as well as Pepcid which did provide mild relief, but symptoms have returned.  She has also felt as if her heart rate is faster than normal.  Based off her watch she has noted her heart rate to go up to 127, but has been consistently staying above 100.  She has been working on diet and exercise.  States that she has been trying the DASH diet, decreased salt intake of recently.  Today she has only had applesauce and 2 grilled wings.  HPI  Past Medical History:  Diagnosis Date  . Anemia   . Anxiety   . Arnold-Chiari malformation (HCC) 02/11/2014  . Depression   . Hypertension   . Migraine   . Obesity     Patient Active Problem List   Diagnosis Date Noted  . Depression, major, recurrent, moderate (HCC) 08/11/2017    Class: Acute  .  Arnold-Chiari malformation (HCC) 02/11/2014  . GERD (gastroesophageal reflux disease) 03/26/2013  . IRREGULAR MENSES 10/09/2007  . HYPERTENSION, BENIGN ESSENTIAL 04/07/2007  . ASCUS PAP 04/07/2007  . OBESITY, NOS 09/18/2006    Past Surgical History:  Procedure Laterality Date  . NONE      OB History   No obstetric history on file.      Home Medications    Prior to Admission medications   Medication Sig Start Date End Date Taking? Authorizing Provider  amLODipine (NORVASC) 10 MG tablet Take 10 mg by mouth daily.   Yes [provider]  Biotin w/ Vitamins C & E (HAIR SKIN & NAILS GUMMIES PO) Take 2 tablets by mouth daily.   Yes [provider]  ferrous sulfate 325 (65 FE) MG tablet Take 325 mg by mouth daily. 02/05/19  Yes [provider]  hydrochlorothiazide (HYDRODIURIL) 25 MG tablet Take 25 mg by mouth daily.   Yes [provider]  meloxicam (MOBIC) 15 MG tablet Take 15 mg by mouth 3 (three) times daily with meals. 04/12/19  Yes [provider]  Multiple Vitamins-Minerals (ADULT GUMMY PO) Take 2 tablets by mouth daily. CHEW   Yes [provider]  traZODone (DESYREL) 100 MG tablet Take 1 tablet (100 mg total) by mouth at bedtime as needed for sleep. 01/13/18  Yes  Eksir, Richard Miu, MD  vitamin C (ASCORBIC ACID) 500 MG tablet Take 500-1,000 mg by mouth daily.   Yes [provider]  acetaminophen (TYLENOL) 500 MG tablet Take 500-1,000 mg by mouth every 6 (six) hours as needed for mild pain or headache.    [provider]  citalopram (CELEXA) 40 MG tablet Take 1 tablet (40 mg total) by mouth daily. Take in the morning Patient not taking: Reported on 05/08/2019 01/13/18 05/08/19  Aundra Dubin, MD  hydrOXYzine (VISTARIL) 25 MG capsule Take 25 mg by mouth 3 (three) times daily.  05/05/19   [provider]  ibuprofen (ADVIL,MOTRIN) 200 MG tablet Take 200-400 mg by mouth every 6 (six) hours as needed  (For pain.).     [provider]    Family History Family History  Problem Relation Age of Onset  . Hypertension Mother   . Depression Maternal Grandmother     Social History Social History   Tobacco Use  . Smoking status: Never Smoker  . Smokeless tobacco: Never Used  Substance Use Topics  . Alcohol use: Yes    Comment: social   . Drug use: No     Allergies   Lisinopril and Vicodin [hydrocodone-acetaminophen]   Review of Systems Review of Systems  Constitutional: Negative for fatigue and fever.  HENT: Negative for congestion, sinus pressure and sore throat.   Eyes: Negative for photophobia, pain and visual disturbance.  Respiratory: Negative for cough and shortness of breath.   Cardiovascular: Positive for chest pain.  Gastrointestinal: Negative for abdominal pain, nausea and vomiting.  Genitourinary: Negative for decreased urine volume and hematuria.  Musculoskeletal: Positive for back pain. Negative for myalgias, neck pain and neck stiffness.  Neurological: Negative for dizziness, syncope, facial asymmetry, speech difficulty, weakness, light-headedness, numbness and headaches.     Physical Exam Triage Vital Signs ED Triage Vitals  Enc Vitals Group     BP 05/27/19 1413 (!) 145/108     Pulse Rate 05/27/19 1413 (!) 113     Resp 05/27/19 1413 (!) 24     Temp 05/27/19 1413 98.7 F (37.1 C)     Temp Source 05/27/19 1413 Oral     SpO2 05/27/19 1413 99 %     Weight 05/27/19 1432 291 lb 3.2 oz (132.1 kg)     Height --      Head Circumference --      Peak Flow --      Pain Score 05/27/19 1413 7     Pain Loc --      Pain Edu? --      Excl. in [place]? --    No data found.  Updated Vital Signs BP (!) 145/108 (BP Location: Left Arm)   Pulse (!) 113   Temp 98.7 F (37.1 C) (Oral)   Resp (!) 24   Wt 291 lb 3.2 oz (132.1 kg)   LMP 05/24/2019 (Exact Date)   SpO2 99%   BMI 51.58 kg/m   Visual Acuity Right Eye Distance:   Left Eye Distance:    Bilateral Distance:    Right Eye Near:   Left Eye Near:    Bilateral Near:     Physical Exam Vitals signs and nursing note reviewed.  Constitutional:      General: She is not in acute distress.    Appearance: She is well-developed.  HENT:     Head: Normocephalic and atraumatic.     Mouth/Throat:     Comments: Oral mucosa pink and  moist, no tonsillar enlargement or exudate. Posterior pharynx patent and nonerythematous, no uvula deviation or swelling. Normal phonation.  Eyes:     Extraocular Movements: Extraocular movements intact.     Conjunctiva/sclera: Conjunctivae normal.     Pupils: Pupils are equal, round, and reactive to light.  Neck:     Musculoskeletal: Neck supple.  Cardiovascular:     Rate and Rhythm: Regular rhythm. Tachycardia present.     Heart sounds: No murmur.  Pulmonary:     Effort: Pulmonary effort is normal. No respiratory distress.     Breath sounds: Normal breath sounds.     Comments: Breathing comfortably at rest, CTABL, no wheezing, rales or other adventitious sounds auscultated Abdominal:     Palpations: Abdomen is soft.     Tenderness: There is no abdominal tenderness.  Skin:    General: Skin is warm and dry.  Neurological:     Mental Status: She is alert.      UC Treatments / Results  Labs (all labs ordered are listed, but only abnormal results are displayed) Labs Reviewed - No data to display  EKG   Radiology No results found.  Procedures Procedures (including critical care time)  Medications Ordered in UC Medications  alum & mag hydroxide-simeth (MAALOX/MYLANTA) 200-200-20 MG/5ML suspension 30 mL (30 mLs Oral Given 05/27/19 1527)    And  lidocaine (XYLOCAINE) 2 % viscous mouth solution 15 mL (15 mLs Oral Given 05/27/19 1527)  alum & mag hydroxide-simeth (MAALOX/MYLANTA) 200-200-20 MG/5ML suspension (has no administration in time range)  lidocaine (XYLOCAINE) 2 % viscous mouth solution (has no administration in time range)     Initial Impression / Assessment and Plan / UC Course  I have reviewed the triage vital signs and the nursing notes.  Pertinent labs & imaging results that were available during my care of the patient were reviewed by me and considered in my medical decision making (see chart for details).  Clinical Course as of May 26 1609  Thu May 27, 2019  1555 128/91 Heart rate 105 before d/c   [HW]    Clinical Course User Index [HW] ,  C, PA-C    EKG sinus tachycardia, no acute signs of ischemia or infarction; improvement in chest discomfort with GI cocktail.  Most likely GERD causing symptoms, likely secondary to recent medicine use.  Will you recommend Maalox as needed for further discomfort, deferring adding in daily medicines recently, given she has had a lot of adverse reactions to medicines she has been taking recently.  Continue to follow-up with PCP/neurology as planned.  Discussed strict return precautions. Patient verbalized understanding and is agreeable with plan.  Final Clinical Impressions(s) / UC Diagnoses   Final diagnoses:  Chest tightness  Gastroesophageal reflux disease, unspecified whether esophagitis present  Tachycardia     Discharge Instructions     Please use maalox as needed for reflux If becoming recurrent try over the counter omeprazole/prilosec 20 mg daily for 2 weeks Follow up with primary care as planned  If symptoms changing or worsening follow up    ED Prescriptions    None     PDMP not reviewed this encounter.   Lew Dawes, PA-C 05/27/19 1610

## 2019-05-28 ENCOUNTER — Ambulatory Visit (HOSPITAL_COMMUNITY)
Admission: RE | Admit: 2019-05-28 | Discharge: 2019-05-28 | Disposition: A | Discharge status: Home / Self Care | Payer: 59 | Source: Ambulatory Visit | Attending: Neurology | Admitting: Neurology

## 2019-05-28 DIAGNOSIS — G459 Transient cerebral ischemic attack, unspecified: Secondary | ICD-10-CM | POA: Diagnosis not present

## 2019-05-28 NOTE — Progress Notes (Signed)
Carotid duplex       has been completed. Preliminary results can be found under CV proc through chart review. Dequavius Kuhner, BS, RDMS, RVT   

## 2019-05-29 ENCOUNTER — Telehealth: Payer: Self-pay | Admitting: Neurology

## 2019-05-29 NOTE — Telephone Encounter (Signed)
I called the patient.  The carotid Doppler study was unremarkable.  Carotid doppler 05/28/19:  Summary: Right Carotid: The extracranial vessels were near-normal with only minimal wall                thickening or plaque.  Left Carotid: The extracranial vessels were near-normal with only minimal wall               thickening or plaque.

## 2019-05-31 ENCOUNTER — Telehealth: Payer: 59 | Admitting: Neurology

## 2019-05-31 NOTE — Progress Notes (Deleted)
PATIENT: NAHJAE HOEG DOB: [birth year]  REASON FOR VISIT: follow up HISTORY FROM: patient  HISTORY OF PRESENT ILLNESS: Today 05/31/19  Ms. Paulino is a 38 year old female with history of morbid obesity and mild Arnold-Chiari malformation.  She was evaluated by Dr. Anne Hahn recently 05/17/2019.  She visited the emergency room on 5 occasions since May 05, 2019, most recent visit was May 15, 2019.  She has begun to complain of headaches that come up the back of the head on the right and go to the front.  She does report a high degree of anxiety.  Recent MRI of the brain was unremarkable, carotid Doppler study was unremarkable.  HISTORY 05/17/2019 Dr. Anne Hahn: Ms. Jarnagin is a 38 year old right-handed black female with a history of obesity and mild Arnold-Chiari malformation.  The patient recently had issues with high blood pressure, she was started on medication to include lisinopril recently by her primary care doctor.  The lisinopril resulted in swelling and she was recently switched to hydrochlorothiazide within the last week.  The patient has visited the emergency room on 5 occasions since May 05, 2019.  The last visit was on 15 May 2019.  The patient has begun having problems with headaches that will come up in the back of the head on the right and go to the front of the head.  The patient may have photophobia and phonophobia with the headache.  She denies any nausea or vomiting.  She may note some numbness that begins on the right face and then spreads to the right arm and to the right foot.  She denies any weakness or vision changes or changes in speech.  She has no swallowing issues but she may have some slight dizziness.  She reports no balance issues or falls, she denies issues controlling the bowels or the bladder.  She denies any significant history of similar headaches in the past.  She has undergone a CT scan of the brain that was unremarkable, she comes to this office for an  evaluation.  She does report a relatively high degree of anxiety, recently she was given a small prescription for Xanax from her primary doctor.  REVIEW OF SYSTEMS: Out of a complete 14 system review of symptoms, the patient complains only of the following symptoms, and all other reviewed systems are negative.  ALLERGIES: Allergies  Allergen Reactions  . Lisinopril Swelling  . Vicodin [Hydrocodone-Acetaminophen] Itching    HOME MEDICATIONS: Outpatient Medications Prior to Visit  Medication Sig Dispense Refill  . acetaminophen (TYLENOL) 500 MG tablet Take 500-1,000 mg by mouth every 6 (six) hours as needed for mild pain or headache.    Marland Kitchen amLODipine (NORVASC) 10 MG tablet Take 10 mg by mouth daily.    . Biotin w/ Vitamins C & E (HAIR SKIN & NAILS GUMMIES PO) Take 2 tablets by mouth daily.    . citalopram (CELEXA) 40 MG tablet Take 1 tablet (40 mg total) by mouth daily. Take in the morning (Patient not taking: Reported on 05/08/2019) 90 tablet 1  . ferrous sulfate 325 (65 FE) MG tablet Take 325 mg by mouth daily.    . hydrochlorothiazide (HYDRODIURIL) 25 MG tablet Take 25 mg by mouth daily.    . hydrOXYzine (VISTARIL) 25 MG capsule Take 25 mg by mouth 3 (three) times daily.     Marland Kitchen ibuprofen (ADVIL,MOTRIN) 200 MG tablet Take 200-400 mg by mouth every 6 (six) hours as needed (For pain.).     Marland Kitchen meloxicam (MOBIC)  15 MG tablet Take 15 mg by mouth 3 (three) times daily with meals.    . Multiple Vitamins-Minerals (ADULT GUMMY PO) Take 2 tablets by mouth daily. CHEW    . traZODone (DESYREL) 100 MG tablet Take 1 tablet (100 mg total) by mouth at bedtime as needed for sleep. 90 tablet 0  . vitamin C (ASCORBIC ACID) 500 MG tablet Take 500-1,000 mg by mouth daily.     No facility-administered medications prior to visit.     PAST MEDICAL HISTORY: Past Medical History:  Diagnosis Date  . Anemia   . Anxiety   . Arnold-Chiari malformation ([place]) 02/11/2014  . Depression   . Hypertension   . Migraine    . Obesity     PAST SURGICAL HISTORY: Past Surgical History:  Procedure Laterality Date  . NONE      FAMILY HISTORY: Family History  Problem Relation Age of Onset  . Hypertension Mother   . Depression Maternal Grandmother     SOCIAL HISTORY: Social History   Socioeconomic History  . Marital status: Single    Spouse name: Not on file  . Number of children: 0  . Years of education: COLLEGE  . Highest education level: Not on file  Occupational History    Employer: [employer]  Social Needs  . Financial resource strain: Not on file  . Food insecurity    Worry: Not on file    Inability: Not on file  . Transportation needs    Medical: Not on file    Non-medical: Not on file  Tobacco Use  . Smoking status: Never Smoker  . Smokeless tobacco: Never Used  Substance and Sexual Activity  . Alcohol use: Yes    Comment: social   . Drug use: No  . Sexual activity: Yes    Birth control/protection: None  Lifestyle  . Physical activity    Days per week: Not on file    Minutes per session: Not on file  . Stress: Not on file  Relationships  . Social Herbalist on phone: Not on file    Gets together: Not on file    Attends religious service: Not on file    Active member of club or organization: Not on file    Attends meetings of clubs or organizations: Not on file    Relationship status: Not on file  . Intimate partner violence    Fear of current or ex partner: Not on file    Emotionally abused: Not on file    Physically abused: Not on file    Forced sexual activity: Not on file  Other Topics Concern  . Not on file  Social History Narrative  . Not on file      PHYSICAL EXAM  There were no vitals filed for this visit. There is no height or weight on file to calculate BMI.  Generalized: Well developed, in no acute distress   Neurological examination  Mentation: Alert oriented to time, place, history taking. Follows all commands speech and language fluent  Cranial nerve II-XII: Pupils were equal round reactive to light. Extraocular movements were full, visual field were full on confrontational test. Facial sensation and strength were normal. Uvula tongue midline. Head turning and shoulder shrug  were normal and symmetric. Motor: The motor testing reveals 5 over 5 strength of all 4 extremities. Good symmetric motor tone is noted throughout.  Sensory: Sensory testing is intact to soft touch on all 4 extremities. No evidence  of extinction is noted.  Coordination: Cerebellar testing reveals good finger-nose-finger and heel-to-shin bilaterally.  Gait and station: Gait is normal. Tandem gait is normal. Romberg is negative. No drift is seen.  Reflexes: Deep tendon reflexes are symmetric and normal bilaterally.   DIAGNOSTIC DATA (LABS, IMAGING, TESTING) - I reviewed patient records, labs, notes, testing and imaging myself where available.  Lab Results  Component Value Date   WBC 6.9 05/05/2019   HGB 15.0 05/15/2019   HCT 44.0 05/15/2019   MCV 88.4 05/05/2019   PLT 387 05/05/2019      Component Value Date/Time   NA 137 05/15/2019 1130   K 3.3 (L) 05/15/2019 1130   CL 98 05/15/2019 1130   CO2 24 05/08/2019 1543   GLUCOSE 96 05/15/2019 1130   BUN 11 05/15/2019 1130   CREATININE 0.70 05/15/2019 1130   CALCIUM 9.1 05/08/2019 1543   PROT 7.5 05/08/2019 1543   ALBUMIN 3.5 05/08/2019 1543   AST 25 05/08/2019 1543   ALT 30 05/08/2019 1543   ALKPHOS 55 05/08/2019 1543   BILITOT 0.3 05/08/2019 1543   GFRNONAA >60 05/08/2019 1543   GFRAA >60 05/08/2019 1543   Lab Results  Component Value Date   CHOL 139 03/26/2013   HDL 40 03/26/2013   LDLCALC 71 03/26/2013   TRIG 139 03/26/2013   CHOLHDL 3.5 03/26/2013   No results found for: HGBA1C No results found for: VITAMINB12 Lab Results  Component Value Date   TSH 1.980 10/13/2008      ASSESSMENT AND PLAN 38 y.o. year old female  has a past medical history of Anemia, Anxiety, Arnold-Chiari  malformation (HCC) (02/11/2014), Depression, Hypertension, Migraine, and Obesity. here with ***   I spent 15 minutes with the patient. 50% of this time was spent   Margie EgeSarah Laiklynn Raczynski, [place] City[place]NP-C, DNP 05/31/2019, 1:02 PM [facility] [address], [address] [place], [place] [id number] [phone]

## 2019-06-01 ENCOUNTER — Ambulatory Visit: Payer: 59 | Admitting: Neurology

## 2019-06-01 ENCOUNTER — Encounter: Payer: Self-pay | Admitting: Neurology

## 2019-06-02 ENCOUNTER — Telehealth: Payer: Self-pay

## 2019-06-02 DIAGNOSIS — Z0289 Encounter for other administrative examinations: Secondary | ICD-10-CM

## 2019-06-02 NOTE — Telephone Encounter (Signed)
Patient no show for appt on 06/01/2019. 

## 2019-06-07 ENCOUNTER — Telehealth: Payer: Self-pay | Admitting: Neurology

## 2019-06-07 ENCOUNTER — Telehealth: Payer: Self-pay

## 2019-06-07 ENCOUNTER — Ambulatory Visit: Payer: 59 | Admitting: Neurology

## 2019-06-07 ENCOUNTER — Other Ambulatory Visit (HOSPITAL_COMMUNITY): Payer: Self-pay | Admitting: Neurology

## 2019-06-07 MED ORDER — PROPRANOLOL HCL ER 60 MG PO CP24: 60.0000 mg | ORAL_CAPSULE | Freq: Every day | ORAL | 11 refills | Status: DC

## 2019-06-07 NOTE — Telephone Encounter (Signed)
Pt reports ongoing headaches since her 05/17/2019 appt.  During this appointment Dr. Jannifer Franklin stated "If the headaches continue, she will be started on a beta-blocker to help her anxiety and to help the headache".  Dr. Jannifer Franklin is out of the office and will not return until 06/14/2019. Will ask work in MD to review message and advise.

## 2019-06-07 NOTE — Telephone Encounter (Signed)
Ok to start inderal la 60 mg daily which I ordered in epic

## 2019-06-07 NOTE — Telephone Encounter (Signed)
Waiting on MD to review.

## 2019-06-07 NOTE — Telephone Encounter (Signed)
Pt is asking for a call from RN for some type of relief for her headaches.  Pt states she has only been taking rapid release tylenol.  Please call

## 2019-06-07 NOTE — Telephone Encounter (Signed)
Unable to get in contact with the patient. LVM letting her know that her appt has been cancelled and she will need to call the office to r/s. Office number was provided.    If the patient calls back please r/s her appt with Judson Roch.

## 2019-06-07 NOTE — Telephone Encounter (Signed)
Pt called wanting to know if there was an update on this. Please advise.

## 2019-06-08 ENCOUNTER — Ambulatory Visit: Payer: 59 | Admitting: Neurology

## 2019-06-08 NOTE — Telephone Encounter (Signed)
Pt returned my call and I discussed with her the option of starting Propranolol 60 mg daily. Pt was agreeable. Pt will call if she has any side effects from the medication.

## 2019-06-08 NOTE — Telephone Encounter (Signed)
LVM asking pt to call me back.

## 2019-06-09 ENCOUNTER — Telehealth: Payer: Self-pay | Admitting: Neurology

## 2019-06-09 NOTE — Telephone Encounter (Signed)
I reached out to the pt. She reports some anxiety on taking the propranolol medication. She and I reviewed her concerns and after she was in a better place. Pt states she was worried if she started this med her HR/ BP might drop to low.  I advised the pt to monitor these numbers and let us know and we could go from there.  Pt was appreciative for the call and had no further questions at this time.  Pt will call back if any issues arise.

## 2019-06-09 NOTE — Telephone Encounter (Signed)
Pt called wanting to discuss the first dosage of her propranolol ER (INDERAL LA) 60 MG 24 hr capsule that she just picked up with RN. Please advise.

## 2019-06-14 ENCOUNTER — Encounter (HOSPITAL_COMMUNITY): Payer: 59

## 2019-06-15 ENCOUNTER — Other Ambulatory Visit: Payer: Self-pay | Admitting: Neurology

## 2019-06-15 MED ORDER — RIZATRIPTAN BENZOATE 10 MG PO TABS: 10.0000 mg | ORAL_TABLET | Freq: Three times a day (TID) | ORAL | 1 refills | Status: DC | PRN

## 2019-06-15 NOTE — Progress Notes (Signed)
PATIENT: Jeanette Santos DOB: Mar 28, 1981  REASON FOR VISIT: follow up HISTORY FROM: patient  HISTORY OF PRESENT ILLNESS: Today 06/16/19  Jeanette Santos is a 38 year old female with history of obesity and mild Arnold-Chiari malformation.  She visited the emergency room on 5 occasions since May 05, 2019 for headaches.  She has complained of some numbness that begins on the right face and spreads to the right arm and to the right foot.  MRI of the brain was unremarkable, carotid Doppler study was unremarkable.  She was started on Inderal 60 mg daily, and has been taking for 1 week.  When she initially presented to the emergency room in October, her blood pressure was elevated 180/121.  Since then, she has started doing the DASH diet, and has lost 27 pounds.  Her blood pressure, her stress level, and her headaches have greatly improved.  She has not had any further episodes of right-sided numbness.  She has been prescribed Maxalt to take as needed, but has yet to take medication.  She says she was unable to tolerate Imitrex.  She has complained of muscle cramps and spasms, but is now taking potassium supplementation.  She presents today for follow-up accompanied by her mother.  HISTORY 05/17/2019 Dr. Jannifer Franklin: Jeanette Santos is a 39 year old right-handed black female with a history of obesity and mild Arnold-Chiari malformation.  The patient recently had issues with high blood pressure, she was started on medication to include lisinopril recently by her primary care doctor.  The lisinopril resulted in swelling and she was recently switched to hydrochlorothiazide within the last week.  The patient has visited the emergency room on 5 occasions since May 05, 2019.  The last visit was on 15 May 2019.  The patient has begun having problems with headaches that will come up in the back of the head on the right and go to the front of the head.  The patient may have photophobia and phonophobia with the headache.   She denies any nausea or vomiting.  She may note some numbness that begins on the right face and then spreads to the right arm and to the right foot.  She denies any weakness or vision changes or changes in speech.  She has no swallowing issues but she may have some slight dizziness.  She reports no balance issues or falls, she denies issues controlling the bowels or the bladder.  She denies any significant history of similar headaches in the past.  She has undergone a CT scan of the brain that was unremarkable, she comes to this office for an evaluation.  She does report a relatively high degree of anxiety, recently she was given a small prescription for Xanax from her primary doctor.  REVIEW OF SYSTEMS: Out of a complete 14 system review of symptoms, the patient complains only of the following symptoms, and all other reviewed systems are negative.  Headache, hypertension  ALLERGIES: Allergies  Allergen Reactions  . Lisinopril Swelling  . Vicodin [Hydrocodone-Acetaminophen] Itching    HOME MEDICATIONS: Outpatient Medications Prior to Visit  Medication Sig Dispense Refill  . amLODipine (NORVASC) 10 MG tablet Take 10 mg by mouth daily.    . hydrochlorothiazide (HYDRODIURIL) 25 MG tablet Take 25 mg by mouth daily.    . Multiple Vitamins-Minerals (ADULT GUMMY PO) Take 2 tablets by mouth daily. CHEW    . Multiple Vitamins-Minerals (HAIR/SKIN/NAILS/BIOTIN PO) Take 2 tablets by mouth daily.    Marland Kitchen omeprazole (PRILOSEC) 40 MG capsule Take 40  mg by mouth daily.    . ondansetron (ZOFRAN-ODT) 8 MG disintegrating tablet Take 8 mg by mouth as needed.    . potassium chloride SA (KLOR-CON) 20 MEQ tablet Take 20 mEq by mouth 2 (two) times daily.    . propranolol ER (INDERAL LA) 60 MG 24 hr capsule Take 1 capsule (60 mg total) by mouth daily. 30 capsule 11  . acetaminophen (TYLENOL) 500 MG tablet Take 500-1,000 mg by mouth every 6 (six) hours as needed for mild pain or headache.    . ferrous sulfate 325 (65  FE) MG tablet Take 325 mg by mouth daily.    . hydrOXYzine (VISTARIL) 25 MG capsule Take 25 mg by mouth 3 (three) times daily.     Marland Kitchen ibuprofen (ADVIL,MOTRIN) 200 MG tablet Take 200-400 mg by mouth every 6 (six) hours as needed (For pain.).     Marland Kitchen meloxicam (MOBIC) 15 MG tablet Take 15 mg by mouth 3 (three) times daily with meals.    . rizatriptan (MAXALT) 10 MG tablet Take 1 tablet (10 mg total) by mouth 3 (three) times daily as needed for migraine. (Patient not taking: Reported on 06/16/2019) 10 tablet 1  . traZODone (DESYREL) 100 MG tablet Take 1 tablet (100 mg total) by mouth at bedtime as needed for sleep. (Patient not taking: Reported on 06/16/2019) 90 tablet 0  . vitamin C (ASCORBIC ACID) 500 MG tablet Take 500-1,000 mg by mouth daily.    . Biotin w/ Vitamins C & E (HAIR SKIN & NAILS GUMMIES PO) Take 2 tablets by mouth daily.    . citalopram (CELEXA) 40 MG tablet Take 1 tablet (40 mg total) by mouth daily. Take in the morning (Patient not taking: Reported on 05/08/2019) 90 tablet 1   No facility-administered medications prior to visit.     PAST MEDICAL HISTORY: Past Medical History:  Diagnosis Date  . Anemia   . Anxiety   . Arnold-Chiari malformation (HCC) 02/11/2014  . Depression   . Hypertension   . Migraine   . Obesity     PAST SURGICAL HISTORY: Past Surgical History:  Procedure Laterality Date  . NONE      FAMILY HISTORY: Family History  Problem Relation Age of Onset  . Hypertension Mother   . Depression Maternal Grandmother     SOCIAL HISTORY: Social History   Socioeconomic History  . Marital status: Single    Spouse name: Not on file  . Number of children: 0  . Years of education: COLLEGE  . Highest education level: Not on file  Occupational History    Employer: [employer]  Social Needs  . Financial resource strain: Not on file  . Food insecurity    Worry: Not on file    Inability: Not on file  . Transportation needs    Medical: Not on file     Non-medical: Not on file  Tobacco Use  . Smoking status: Never Smoker  . Smokeless tobacco: Never Used  Substance and Sexual Activity  . Alcohol use: Yes    Comment: social   . Drug use: No  . Sexual activity: Yes    Birth control/protection: None  Lifestyle  . Physical activity    Days per week: Not on file    Minutes per session: Not on file  . Stress: Not on file  Relationships  . Social Musician on phone: Not on file    Gets together: Not on file    Attends  religious service: Not on file    Active member of club or organization: Not on file    Attends meetings of clubs or organizations: Not on file    Relationship status: Not on file  . Intimate partner violence    Fear of current or ex partner: Not on file    Emotionally abused: Not on file    Physically abused: Not on file    Forced sexual activity: Not on file  Other Topics Concern  . Not on file  Social History Narrative  . Not on file    PHYSICAL EXAM  Vitals:   06/16/19 1426  BP: 106/64  Pulse: 92  Temp: 97.8 F (36.6 C)  TempSrc: Oral  Weight: 288 lb 9.6 oz (130.9 kg)  Height: 5\' 3"  (1.6 m)   Body mass index is 51.12 kg/m.  Generalized: Well developed, in no acute distress   Neurological examination  Mentation: Alert oriented to time, place, history taking. Follows all commands speech and language fluent Cranial nerve II-XII: Pupils were equal round reactive to light. Extraocular movements were full, visual field were full on confrontational test. Facial sensation and strength were normal. Head turning and shoulder shrug  were normal and symmetric. Motor: The motor testing reveals 5 over 5 strength of all 4 extremities. Good symmetric motor tone is noted throughout.  Sensory: Sensory testing is intact to soft touch on all 4 extremities. No evidence of extinction is noted.  Coordination: Cerebellar testing reveals good finger-nose-finger and heel-to-shin bilaterally.  Gait and station:  Gait is normal. Reflexes: Deep tendon reflexes are symmetric and normal bilaterally.   DIAGNOSTIC DATA (LABS, IMAGING, TESTING) - I reviewed patient records, labs, notes, testing and imaging myself where available.  Lab Results  Component Value Date   WBC 6.9 05/05/2019   HGB 15.0 05/15/2019   HCT 44.0 05/15/2019   MCV 88.4 05/05/2019   PLT 387 05/05/2019      Component Value Date/Time   NA 137 05/15/2019 1130   K 3.3 (L) 05/15/2019 1130   CL 98 05/15/2019 1130   CO2 24 05/08/2019 1543   GLUCOSE 96 05/15/2019 1130   BUN 11 05/15/2019 1130   CREATININE 0.70 05/15/2019 1130   CALCIUM 9.1 05/08/2019 1543   PROT 7.5 05/08/2019 1543   ALBUMIN 3.5 05/08/2019 1543   AST 25 05/08/2019 1543   ALT 30 05/08/2019 1543   ALKPHOS 55 05/08/2019 1543   BILITOT 0.3 05/08/2019 1543   GFRNONAA >60 05/08/2019 1543   GFRAA >60 05/08/2019 1543   Lab Results  Component Value Date   CHOL 139 03/26/2013   HDL 40 03/26/2013   LDLCALC 71 03/26/2013   TRIG 139 03/26/2013   CHOLHDL 3.5 03/26/2013   No results found for: HGBA1C No results found for: VITAMINB12 Lab Results  Component Value Date   TSH 1.980 10/13/2008      ASSESSMENT AND PLAN 38 y.o. year old female  has a past medical history of Anemia, Anxiety, Arnold-Chiari malformation (HCC) (02/11/2014), Depression, Hypertension, Migraine, and Obesity. here with:  1.  Headache, possible migraine 2.  Hypertension 3.  Anxiety 4.  History of mild Arnold-Chiari malformation  Since last seen, she has lost 27 pounds, and is adhering to the DASH diet. Her headaches have improved.  She will remain on Inderal 60 mg daily, Maxalt as needed.  Her blood pressure is under much better control while taking Inderal and hydrochlorothiazide (today 106/64).  She is now taking potassium supplementation to help with muscle  cramps and spasms.  The Inderal seems to be an excellent choice, as it is benefiting her anxiety, palpitations, blood pressure, and  headaches. She will follow-up in 3 months or sooner if needed.  I did advise if her symptoms worsen or she develops any new symptoms she should let us know.  I spent 15 minutes with the patient. 50% of this time was spent discussing her plan of care.   Margie Ege, AGNP-C, DNP 06/16/2019, 3:10 PM [facility] [address], [address] [place], [place] [address] [phone]

## 2019-06-16 ENCOUNTER — Ambulatory Visit: Payer: 59 | Admitting: Neurology

## 2019-06-16 ENCOUNTER — Other Ambulatory Visit: Payer: Self-pay

## 2019-06-16 ENCOUNTER — Encounter: Payer: Self-pay | Admitting: Neurology

## 2019-06-16 ENCOUNTER — Ambulatory Visit (INDEPENDENT_AMBULATORY_CARE_PROVIDER_SITE_OTHER): Payer: 59 | Admitting: Neurology

## 2019-06-16 VITALS — BP 106/64 | HR 92 | Temp 97.8°F | Ht 63.0 in | Wt 288.6 lb

## 2019-06-16 DIAGNOSIS — R519 Headache, unspecified: Secondary | ICD-10-CM | POA: Diagnosis not present

## 2019-06-16 DIAGNOSIS — Q0700 Arnold-Chiari syndrome without spina bifida or hydrocephalus: Secondary | ICD-10-CM

## 2019-06-16 NOTE — Progress Notes (Signed)
I have read the note, and I agree with the clinical assessment and plan.  Shanette Tamargo K Bracken Moffa   

## 2019-06-16 NOTE — Patient Instructions (Addendum)
Continue Inderal 60 mg daily   Take Maxalt as needed  I will see you in 3 months  Happy Thanksgiving :)

## 2019-06-17 ENCOUNTER — Encounter (HOSPITAL_COMMUNITY): Payer: 59

## 2019-06-21 ENCOUNTER — Encounter: Payer: Self-pay | Admitting: Neurology

## 2019-06-22 ENCOUNTER — Institutional Professional Consult (permissible substitution): Payer: Self-pay | Admitting: Neurology

## 2019-07-12 ENCOUNTER — Ambulatory Visit: Payer: 59 | Admitting: Neurology

## 2019-07-12 ENCOUNTER — Other Ambulatory Visit: Payer: 59

## 2019-07-19 ENCOUNTER — Other Ambulatory Visit: Payer: 59

## 2019-07-26 ENCOUNTER — Encounter

## 2019-07-26 ENCOUNTER — Ambulatory Visit: Payer: 59 | Admitting: Neurology

## 2019-08-30 ENCOUNTER — Other Ambulatory Visit: Payer: 59

## 2019-09-03 ENCOUNTER — Other Ambulatory Visit: Payer: Self-pay

## 2019-09-09 ENCOUNTER — Ambulatory Visit: Payer: 59 | Admitting: Neurology

## 2019-09-13 ENCOUNTER — Ambulatory Visit: Payer: BC Managed Care – PPO | Attending: Internal Medicine

## 2019-09-13 ENCOUNTER — Other Ambulatory Visit: Payer: Self-pay

## 2019-09-13 DIAGNOSIS — Z20822 Contact with and (suspected) exposure to covid-19: Secondary | ICD-10-CM | POA: Insufficient documentation

## 2019-09-14 ENCOUNTER — Other Ambulatory Visit: Payer: Self-pay

## 2019-09-14 ENCOUNTER — Ambulatory Visit (INDEPENDENT_AMBULATORY_CARE_PROVIDER_SITE_OTHER): Payer: BC Managed Care – PPO | Admitting: Neurology

## 2019-09-14 ENCOUNTER — Encounter: Payer: Self-pay | Admitting: Neurology

## 2019-09-14 VITALS — BP 124/86 | HR 78 | Temp 97.9°F | Ht 63.0 in | Wt 265.4 lb

## 2019-09-14 DIAGNOSIS — R519 Headache, unspecified: Secondary | ICD-10-CM

## 2019-09-14 LAB — NOVEL CORONAVIRUS, NAA: SARS-CoV-2, NAA: NOT DETECTED

## 2019-09-14 MED ORDER — DICLOFENAC POTASSIUM 50 MG PO TABS: 50.0000 mg | ORAL_TABLET | Freq: Two times a day (BID) | ORAL | 0 refills | Status: DC | PRN

## 2019-09-14 MED ORDER — PROPRANOLOL HCL ER 120 MG PO CP24: 120.0000 mg | ORAL_CAPSULE | Freq: Every day | ORAL | 11 refills | Status: DC

## 2019-09-14 MED ORDER — BACLOFEN 5 MG PO TABS: 5.0000 mg | ORAL_TABLET | Freq: Two times a day (BID) | ORAL | 3 refills | Status: DC

## 2019-09-14 NOTE — Progress Notes (Signed)
PATIENT: Jeanette Santos DOB: [birth year]  REASON FOR VISIT: follow up HISTORY FROM: patient  HISTORY OF PRESENT ILLNESS: Today 09/14/19  Jeanette Santos is a 39 year old female with history of obesity and mild Arnold-Chiari malformation.  She has complained of headaches in the past, has been to the ER with right-sided weakness.  She was found to have hypertension.  She made significant lifestyle changes.  She remains on Inderal.  She has been doing well, reports for the last 3 weeks, has had increase in headache.  She describes the headache as occurring in the right occipital area, radiating forward on the right side, behind her right eye.  This was her typical headache pattern previously.  She did see an orthopedic, last week was given trigger point injections in her neck, that relieved the neck pain.  She was prescribed Zanaflex, which is helpful but makes her drowsy.  Since last seen, she has lost an additional 20 pounds.  She has complained of the headache daily for the last several weeks, she took Maxalt, felt it made it worse.  With a headache, she reports some photophobia, denies nausea.  She works in front of a computer, will have to step away from her work or leave.  She presents today for evaluation unaccompanied.  She has not had any further episodes of numbness on the right side.  HISTORY 06/15/2020 SS: Jeanette Santos is a 39 year old female with history of obesity and mild Arnold-Chiari malformation.  She visited the emergency room on 5 occasions since May 05, 2019 for headaches.  She has complained of some numbness that begins on the right face and spreads to the right arm and to the right foot.  MRI of the brain was unremarkable, carotid Doppler study was unremarkable.  She was started on Inderal 60 mg daily, and has been taking for 1 week.  When she initially presented to the emergency room in October, her blood pressure was elevated 180/121.  Since then, she has started doing the DASH diet,  and has lost 27 pounds.  Her blood pressure, her stress level, and her headaches have greatly improved.  She has not had any further episodes of right-sided numbness.  She has been prescribed Maxalt to take as needed, but has yet to take medication.  She says she was unable to tolerate Imitrex.  She has complained of muscle cramps and spasms, but is now taking potassium supplementation.  She presents today for follow-up accompanied by her mother.  HISTORY 05/17/2019 Dr. Anne Hahn: Jeanette Santos a 39 year old right-handed black female with a history of obesity and mild Arnold-Chiari malformation. The patient recently had issues with high blood pressure, she was started on medication to include lisinopril recently by her primary care doctor. The lisinopril resulted in swelling and she was recently switched to hydrochlorothiazide within the last week. The patient has visited the emergency room on 5 occasions since May 05, 2019. The last visit was on 15 May 2019. The patient has begun having problems with headaches that will come up in the back of the head on the right and go to the front of the head. The patient may have photophobia and phonophobia with the headache. She denies any nausea or vomiting. She may note some numbness that begins on the right face and then spreads to the right arm and to the right foot. She denies any weakness or vision changes or changes in speech. She has no swallowing issues but she may have some slight dizziness. She  reports no balance issues or falls, she denies issues controlling the bowels or the bladder. She denies any significant history of similar headaches in the past. She has undergone a CT scan of the brain that was unremarkable, she comes to this office for an evaluation. She does report a relatively high degree of anxiety, recently she was given a small prescription for Xanax from her primary doctor.   REVIEW OF SYSTEMS: Out of a complete 14 system review  of symptoms, the patient complains only of the following symptoms, and all other reviewed systems are negative.  Headache  ALLERGIES: Allergies  Allergen Reactions  . Lisinopril Swelling  . Vicodin [Hydrocodone-Acetaminophen] Itching    HOME MEDICATIONS: Outpatient Medications Prior to Visit  Medication Sig Dispense Refill  . acetaminophen (TYLENOL) 500 MG tablet Take 500-1,000 mg by mouth every 6 (six) hours as needed for mild pain or headache.    Marland Kitchen amLODipine (NORVASC) 10 MG tablet Take 10 mg by mouth daily.    . ferrous sulfate 325 (65 FE) MG tablet Take 325 mg by mouth daily.    . hydrochlorothiazide (HYDRODIURIL) 25 MG tablet Take 12.5 mg by mouth as needed (depending on her numbers).     . hydrOXYzine (VISTARIL) 25 MG capsule Take 25 mg by mouth 3 (three) times daily.     Marland Kitchen ibuprofen (ADVIL,MOTRIN) 200 MG tablet Take 200-400 mg by mouth every 6 (six) hours as needed (For pain.).     Marland Kitchen meloxicam (MOBIC) 15 MG tablet Take 15 mg by mouth 3 (three) times daily with meals.    . Multiple Vitamins-Minerals (ADULT GUMMY PO) Take 2 tablets by mouth daily. CHEW    . Multiple Vitamins-Minerals (HAIR/SKIN/NAILS/BIOTIN PO) Take 2 tablets by mouth daily.    Marland Kitchen omeprazole (PRILOSEC) 40 MG capsule Take 40 mg by mouth daily.    . ondansetron (ZOFRAN-ODT) 8 MG disintegrating tablet Take 8 mg by mouth as needed.    . potassium chloride SA (KLOR-CON) 20 MEQ tablet Take 20 mEq by mouth 2 (two) times daily.    . rizatriptan (MAXALT) 10 MG tablet Take 1 tablet (10 mg total) by mouth 3 (three) times daily as needed for migraine. 10 tablet 1  . tiZANidine (ZANAFLEX) 2 MG tablet Take 2 mg by mouth at bedtime.    . vitamin C (ASCORBIC ACID) 500 MG tablet Take 500-1,000 mg by mouth daily.    . propranolol ER (INDERAL LA) 60 MG 24 hr capsule Take 1 capsule (60 mg total) by mouth daily. 30 capsule 11  . traZODone (DESYREL) 100 MG tablet Take 1 tablet (100 mg total) by mouth at bedtime as needed for sleep. 90  tablet 0   No facility-administered medications prior to visit.    PAST MEDICAL HISTORY: Past Medical History:  Diagnosis Date  . Anemia   . Anxiety   . Arnold-Chiari malformation (HCC) 02/11/2014  . Depression   . Hypertension   . Migraine   . Obesity     PAST SURGICAL HISTORY: Past Surgical History:  Procedure Laterality Date  . NONE      FAMILY HISTORY: Family History  Problem Relation Age of Onset  . Hypertension Mother   . Depression Maternal Grandmother     SOCIAL HISTORY: Social History   Socioeconomic History  . Marital status: Single    Spouse name: Not on file  . Number of children: 0  . Years of education: COLLEGE  . Highest education level: Not on file  Occupational History  Employer: [employer]  Tobacco Use  . Smoking status: Never Smoker  . Smokeless tobacco: Never Used  Substance and Sexual Activity  . Alcohol use: Yes    Comment: social   . Drug use: No  . Sexual activity: Yes    Birth control/protection: None  Other Topics Concern  . Not on file  Social History Narrative  . Not on file   Social Determinants of Health   Financial Resource Strain:   . Difficulty of Paying Living Expenses: Not on file  Food Insecurity:   . Worried About Charity fundraiser in the Last Year: Not on file  . Ran Out of Food in the Last Year: Not on file  Transportation Needs:   . Lack of Transportation (Medical): Not on file  . Lack of Transportation (Non-Medical): Not on file  Physical Activity:   . Days of Exercise per Week: Not on file  . Minutes of Exercise per Session: Not on file  Stress:   . Feeling of Stress : Not on file  Social Connections:   . Frequency of Communication with Friends and Family: Not on file  . Frequency of Social Gatherings with Friends and Family: Not on file  . Attends Religious Services: Not on file  . Active Member of Clubs or Organizations: Not on file  . Attends Archivist Meetings: Not on file  . Marital  Status: Not on file  Intimate Partner Violence:   . Fear of Current or Ex-Partner: Not on file  . Emotionally Abused: Not on file  . Physically Abused: Not on file  . Sexually Abused: Not on file  PHYSICAL EXAM  Vitals:   09/14/19 1118  BP: 124/86  Pulse: 78  Temp: 97.9 F (36.6 C)  TempSrc: Oral  Weight: 265 lb 6.4 oz (120.4 kg)  Height: 5\' 3"  (1.6 m)   Body mass index is 47.01 kg/m.  Generalized: Well developed, in no acute distress   Neurological examination  Mentation: Alert oriented to time, place, history taking. Follows all commands speech and language fluent Cranial nerve II-XII: Pupils were equal round reactive to light. Extraocular movements were full, visual field were full on confrontational test. Facial sensation and strength were normal.  Head turning and shoulder shrug were normal and symmetric. Motor: The motor testing reveals 5 over 5 strength of all 4 extremities. Good symmetric motor tone is noted throughout.  Sensory: Sensory testing is intact to soft touch on all 4 extremities. No evidence of extinction is noted.  Coordination: Cerebellar testing reveals good finger-nose-finger and heel-to-shin bilaterally.  Gait and station: Gait is normal. Tandem gait is normal.  Reflexes: Deep tendon reflexes are symmetric and normal bilaterally.   DIAGNOSTIC DATA (LABS, IMAGING, TESTING) - I reviewed patient records, labs, notes, testing and imaging myself where available.  Lab Results  Component Value Date   WBC 6.9 05/05/2019   HGB 15.0 05/15/2019   HCT 44.0 05/15/2019   MCV 88.4 05/05/2019   PLT 387 05/05/2019      Component Value Date/Time   NA 137 05/15/2019 1130   K 3.3 (L) 05/15/2019 1130   CL 98 05/15/2019 1130   CO2 24 05/08/2019 1543   GLUCOSE 96 05/15/2019 1130   BUN 11 05/15/2019 1130   CREATININE 0.70 05/15/2019 1130   CALCIUM 9.1 05/08/2019 1543   PROT 7.5 05/08/2019 1543   ALBUMIN 3.5 05/08/2019 1543   AST 25 05/08/2019 1543   ALT 30  05/08/2019 1543  ALKPHOS 55 05/08/2019 1543   BILITOT 0.3 05/08/2019 1543   GFRNONAA >60 05/08/2019 1543   GFRAA >60 05/08/2019 1543   Lab Results  Component Value Date   CHOL 139 03/26/2013   HDL 40 03/26/2013   LDLCALC 71 03/26/2013   TRIG 139 03/26/2013   CHOLHDL 3.5 03/26/2013   No results found for: HGBA1C No results found for: VITAMINB12 Lab Results  Component Value Date   TSH 1.980 10/13/2008      ASSESSMENT AND PLAN 39 y.o. year old female  has a past medical history of Anemia, Anxiety, Arnold-Chiari malformation (HCC) (02/11/2014), Depression, Hypertension, Migraine, and Obesity. here with:  1.  Headache, possible migraine 2.  Hypertension 3.  Anxiety 4.  History of mild Arnold-Chiari malformation  She has had an increase in her headaches in the last 3 weeks, for unknown reason.  The headaches have been her typical pattern, starting in the right occipital area, spreading forward.  She did benefit from trigger point injections by her orthopedic in her neck last week.  I will increase her Inderal LA to 120 mg daily, let her try baclofen 5 mg twice a day.  I will also give her diclofenac potassium to take as needed, may combine with tizanidine for significant headache.  With the increase in Inderal, she will check her blood pressure and heart rate 3 times daily.  I have asked her to send me a MyChart message next week and let me know how she is doing. We may consider Topamax if needed.  She has done well to lose an additional 20 pounds in the last 3 months.  Her heart rate was 78, blood pressure 124/86 today. MRI of the brain in October 2020 was unremarkable, Carotid doppler was unremarkable. She has had no further episodes of numbness. She will follow-up in 3 months or sooner if needed. I did advise if her symptoms worsen or she develops any new symptoms she should let us know.   I spent 15 minutes with the patient. 50% of this time was spent discussing her plan of care.     Margie Ege, AGNP-C, DNP 09/14/2019, 11:55 AM [facility] [address] [place], [place] [address] [phone]

## 2019-09-14 NOTE — Patient Instructions (Signed)
Try to increase your Inderal LA to 120 mg daily, check your HR and BP 3 times daily Try taking baclofen 5 mg twice daily to see if less sedating than Zanaflex, use Zanaflex as needed Take Diclofenac potassium as needed, may combine with Zanaflex if needed

## 2019-09-15 DIAGNOSIS — Z0289 Encounter for other administrative examinations: Secondary | ICD-10-CM

## 2019-09-15 NOTE — Progress Notes (Signed)
I have read the note, and I agree with the clinical assessment and plan.  Smt. Loder K Ajanay Farve   

## 2019-09-16 ENCOUNTER — Encounter: Payer: Self-pay | Admitting: *Deleted

## 2019-09-16 ENCOUNTER — Telehealth: Payer: Self-pay | Admitting: Neurology

## 2019-09-16 ENCOUNTER — Telehealth: Payer: Self-pay | Admitting: *Deleted

## 2019-09-16 ENCOUNTER — Other Ambulatory Visit: Payer: Self-pay

## 2019-09-16 ENCOUNTER — Encounter: Payer: Self-pay | Admitting: Neurology

## 2019-09-16 NOTE — Telephone Encounter (Signed)
I called the patient.  She is at higher dose of Inderal 120 mg yesterday and today.  She also took Zanaflex last night before going to bed.  She says she woke up around 10 PM with burning in both legs.  She went and laid on the couch, elevated her feet, fell back to sleep.  When she woke up this morning she felt fine.  She said she had a slight headache this morning, but headaches some better with higher medication.  Her heart rate and her blood pressure has been good.  She is at work today.  If the burning continues, it is possible it is related to the Inderal, but not a common side effect.  She may decrease her dose, see how she does, let me know next week.

## 2019-09-16 NOTE — Telephone Encounter (Signed)
[employer] papers completed, on NP's desk for review, completion, signature.

## 2019-09-16 NOTE — Telephone Encounter (Signed)
We will fill out paperwork has migraine headache. She meets criteria for migraine, (headache may last 4- 72 hours, unilateral on the right, moderate-severe intensity,, photophobia and phonophobia).

## 2019-09-16 NOTE — Telephone Encounter (Signed)
Received [employer] paperwork. Placed on NP's desk for review before completing.

## 2019-09-16 NOTE — Telephone Encounter (Signed)
Filling out FMLA papers is somewhat difficult at this time, as her headaches have been doing well, have recently increased. I have written for 2-3 episodes per month, 1-2 days per episode. Check with patient, also I could include something about some time for her to walk away from her computer if needed. If papers need to be adjusted in the future I have no problem with that.

## 2019-09-16 NOTE — Telephone Encounter (Signed)
Called patient to discuss possibly adding more breaks per NP. She gets two 15 min breaks, 10 am and 3 pm  and 1 hour lunch in an 8 hour work day. She stated that additional breaks to walk away from her computer would be very helpful. Per NP will add two additional 10 minutes breaks at time of her choosing to [facility]. FMLA papers completed, sent to medical records for processing.

## 2019-09-20 ENCOUNTER — Ambulatory Visit: Payer: 59 | Admitting: Neurology

## 2019-10-04 ENCOUNTER — Encounter: Payer: Self-pay | Admitting: Neurology

## 2019-10-04 ENCOUNTER — Telehealth: Payer: Self-pay | Admitting: *Deleted

## 2019-10-04 NOTE — Telephone Encounter (Signed)
Pt fmla form @ the front desk for p/u. 

## 2019-10-06 ENCOUNTER — Telehealth: Payer: Self-pay | Admitting: Neurology

## 2019-10-06 MED ORDER — PROPRANOLOL HCL 20 MG PO TABS: ORAL_TABLET | ORAL | 0 refills | Status: DC

## 2019-10-06 NOTE — Telephone Encounter (Signed)
I called the patient, is taking baclofen 5 mg twice daily, helping headaches. She wants to stop the Inderal, had tingling in her legs with 120 mg, she still has some intermittent tingling in both legs, wants to stop Inderal LA 60 mg daily to if side effect. She took diclofenac says, it elevated her BP. Hasn't had any issues with headaches of lately. Will give her propranolol 20 mg twice daily x 1 week, then take 20 mg daily x 1 week, then stop the medication to wean off. If headaches increase, she will contact me, I would consider Topamax. Check blood pressure coming off the propranolol.

## 2019-10-19 ENCOUNTER — Other Ambulatory Visit: Payer: Self-pay | Admitting: Neurology

## 2019-10-19 MED ORDER — TOPIRAMATE 25 MG PO TABS: ORAL_TABLET | ORAL | 3 refills | Status: DC

## 2019-10-19 NOTE — Progress Notes (Signed)
Will try Topamax for her headaches. Working up to 75 mg at bedtime.

## 2019-11-25 ENCOUNTER — Encounter: Payer: Self-pay | Admitting: Neurology

## 2019-11-29 ENCOUNTER — Ambulatory Visit: Payer: Self-pay | Attending: Internal Medicine

## 2019-11-29 DIAGNOSIS — Z20822 Contact with and (suspected) exposure to covid-19: Secondary | ICD-10-CM

## 2019-11-30 LAB — NOVEL CORONAVIRUS, NAA: SARS-CoV-2, NAA: NOT DETECTED

## 2019-11-30 LAB — SARS-COV-2, NAA 2 DAY TAT

## 2019-12-14 ENCOUNTER — Encounter: Payer: Self-pay | Admitting: Neurology

## 2019-12-14 ENCOUNTER — Other Ambulatory Visit: Payer: Self-pay

## 2019-12-14 ENCOUNTER — Ambulatory Visit (INDEPENDENT_AMBULATORY_CARE_PROVIDER_SITE_OTHER): Payer: BC Managed Care – PPO | Admitting: Neurology

## 2019-12-14 VITALS — BP 122/82 | HR 101 | Ht 63.0 in | Wt 252.0 lb

## 2019-12-14 DIAGNOSIS — R202 Paresthesia of skin: Secondary | ICD-10-CM | POA: Diagnosis not present

## 2019-12-14 DIAGNOSIS — R519 Headache, unspecified: Secondary | ICD-10-CM | POA: Diagnosis not present

## 2019-12-14 NOTE — Progress Notes (Signed)
PATIENT: Jeanette Santos DOB: 05-17-1981  REASON FOR VISIT: follow up HISTORY FROM: patient  HISTORY OF PRESENT ILLNESS: Today 12/14/19  [name] is a 39 year old female with history of obesity, hypertension, headache, and mild Arnold-Chiari malformation.,  She was on Inderal for headaches, dose was increased to 120 mg LA, indicated she experienced tingling and burning in her legs around that time, after taking 1 dose, she decreased the dose back to 60 mg, continue with symptoms, we slowly weaned her off the medication, over a few weeks.  She continues to complain of tingling and burning from her ankles to knees bilaterally, in the evenings.  Denies any changes to the balance or falls.  Does not keep her up at night.  She does not notice any symptoms during the day.  Denies history of diabetes.  She continues to lose weight, in the last 3 months, has lost an additional 13 pounds, down to 252 lbs.  She denies low back pain, does have some chronic neck, mid back pain.  No changes to bowels or bladder.  For headaches, never started Topamax, stop baclofen.  When she came off Inderal, her blood pressure did go up, she had to start taking HCTZ daily, continue amlodipine.  Reports on average about 2 headaches a week, taking Excedrin Migraine, tension variety with good benefit.  With headaches, can have migraine features of photophobia, photophobia, and nausea. 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 She does not wish to start any new medications, says she is sensitive to medicines, she can be the " unicorn".  She indicates just last week, she started feeling like herself again, does still have the symptoms of paresthesia.  She presents today for evaluation unaccompanied.  HISTORY 09/14/2019 SS: Ms. Dragos is a 39 year old female with history of obesity and mild Arnold-Chiari malformation.  She has complained of headaches in the past, has been to the ER with right-sided weakness.  She was found to  have hypertension.  She made significant lifestyle changes.  She remains on Inderal.  She has been doing well, reports for the last 3 weeks, has had increase in headache.  She describes the headache as occurring in the right occipital area, radiating forward on the right side, behind her right eye.  This was her typical headache pattern previously.  She did see an orthopedic, last week was given trigger point injections in her neck, that relieved the neck pain.  She was prescribed Zanaflex, which is helpful but makes her drowsy.  Since last seen, she has lost an additional 20 pounds.  She has complained of the headache daily for the last several weeks, she took Maxalt, felt it made it worse.  With a headache, she reports some photophobia, denies nausea.  She works in front of a computer, will have to step away from her work or leave.  She presents today for evaluation unaccompanied.  She has not had any further episodes of numbness on the right side.  REVIEW OF SYSTEMS: Out of a complete 14 system review of symptoms, the patient complains only of the following symptoms, and all other reviewed systems are negative.  Headache, tingling  ALLERGIES: Allergies  Allergen Reactions  . Lisinopril Swelling  . Vicodin [Hydrocodone-Acetaminophen] Itching    HOME MEDICATIONS: Outpatient Medications Prior to Visit  Medication Sig Dispense Refill  . acetaminophen (TYLENOL) 500 MG tablet Take 500-1,000 mg by mouth every 6 (six) hours as needed for mild pain or headache.    Marland Kitchen  amLODipine (NORVASC) 10 MG tablet Take 10 mg by mouth daily.    . ferrous sulfate 325 (65 FE) MG tablet Take 325 mg by mouth daily.    . hydrochlorothiazide (HYDRODIURIL) 25 MG tablet Take 12.5 mg by mouth as needed (depending on her numbers).     Marland Kitchen ibuprofen (ADVIL,MOTRIN) 200 MG tablet Take 200-400 mg by mouth every 6 (six) hours as needed (For pain.).     Marland Kitchen Multiple Vitamins-Minerals (ADULT GUMMY PO) Take 2 tablets by mouth daily. CHEW     . Multiple Vitamins-Minerals (HAIR/SKIN/NAILS/BIOTIN PO) Take 2 tablets by mouth daily.    Marland Kitchen omeprazole (PRILOSEC) 40 MG capsule Take 40 mg by mouth daily.    . ondansetron (ZOFRAN-ODT) 8 MG disintegrating tablet Take 8 mg by mouth as needed.    . potassium chloride SA (KLOR-CON) 20 MEQ tablet Take 20 mEq by mouth 2 (two) times daily.    . vitamin C (ASCORBIC ACID) 500 MG tablet Take 500-1,000 mg by mouth daily.    Marland Kitchen topiramate (TOPAMAX) 25 MG tablet Take 1 at bedtime x 3 nights, then take 2 at bedtime x 3 nights, then take 3 at bedtime 120 tablet 3  . Baclofen 5 MG TABS Take 5 mg by mouth 2 (two) times daily. 60 tablet 3  . hydrOXYzine (VISTARIL) 25 MG capsule Take 25 mg by mouth 3 (three) times daily.     . meloxicam (MOBIC) 15 MG tablet Take 15 mg by mouth 3 (three) times daily with meals.    . rizatriptan (MAXALT) 10 MG tablet Take 1 tablet (10 mg total) by mouth 3 (three) times daily as needed for migraine. 10 tablet 1  . tiZANidine (ZANAFLEX) 2 MG tablet Take 2 mg by mouth at bedtime.     No facility-administered medications prior to visit.    PAST MEDICAL HISTORY: Past Medical History:  Diagnosis Date  . Anemia   . Anxiety   . Arnold-Chiari malformation ([place]) 02/11/2014  . Depression   . Hypertension   . Migraine   . Obesity     PAST SURGICAL HISTORY: Past Surgical History:  Procedure Laterality Date  . NONE      FAMILY HISTORY: Family History  Problem Relation Age of Onset  . Hypertension Mother   . Depression Maternal Grandmother     SOCIAL HISTORY: Social History   Socioeconomic History  . Marital status: Single    Spouse name: Not on file  . Number of children: 0  . Years of education: COLLEGE  . Highest education level: Not on file  Occupational History    Employer: [employer]  Tobacco Use  . Smoking status: Never Smoker  . Smokeless tobacco: Never Used  Substance and Sexual Activity  . Alcohol use: Yes    Comment: social   . Drug use: No  .  Sexual activity: Yes    Birth control/protection: None  Other Topics Concern  . Not on file  Social History Narrative  . Not on file   Social Determinants of Health   Financial Resource Strain:   . Difficulty of Paying Living Expenses:   Food Insecurity:   . Worried About Charity fundraiser in the Last Year:   . Arboriculturist in the Last Year:   Transportation Needs:   . Film/video editor (Medical):   Marland Kitchen Lack of Transportation (Non-Medical):   Physical Activity:   . Days of Exercise per Week:   . Minutes of Exercise per Session:  Stress:   . Feeling of Stress :   Social Connections:   . Frequency of Communication with Friends and Family:   . Frequency of Social Gatherings with Friends and Family:   . Attends Religious Services:   . Active Member of Clubs or Organizations:   . Attends Archivist Meetings:   Marland Kitchen Marital Status:   Intimate Partner Violence:   . Fear of Current or Ex-Partner:   . Emotionally Abused:   Marland Kitchen Physically Abused:   . Sexually Abused:    PHYSICAL EXAM  Vitals:   12/14/19 0822  BP: 122/82  Pulse: (!) 101  Weight: 252 lb (114.3 kg)  Height: 5' 3"  (1.6 m)   Body mass index is 44.64 kg/m.  Generalized: Well developed, in no acute distress   Neurological examination  Mentation: Alert oriented to time, place, history taking. Follows all commands speech and language fluent Cranial nerve II-XII: Pupils were equal round reactive to light. Extraocular movements were full, visual field were full on confrontational test. Facial sensation and strength were normal. Head turning and shoulder shrug  were normal and symmetric. Motor: The motor testing reveals 5 over 5 strength of all 4 extremities. Good symmetric motor tone is noted throughout.  Sensory: To lower extremities, sensation is intact to soft touch, pinprick, and vibration sensation Coordination: Cerebellar testing reveals good finger-nose-finger and heel-to-shin bilaterally.  Gait  and station: Gait is normal. Tandem gait is normal. Romberg is negative. No drift is seen.  Able to walk on heels and tiptoe without difficulty Reflexes: Deep tendon reflexes are symmetric and normal bilaterally.   DIAGNOSTIC DATA (LABS, IMAGING, TESTING) - I reviewed patient records, labs, notes, testing and imaging myself where available.  Lab Results  Component Value Date   WBC 6.9 05/05/2019   HGB 15.0 05/15/2019   HCT 44.0 05/15/2019   MCV 88.4 05/05/2019   PLT 387 05/05/2019      Component Value Date/Time   NA 137 05/15/2019 1130   K 3.3 (L) 05/15/2019 1130   CL 98 05/15/2019 1130   CO2 24 05/08/2019 1543   GLUCOSE 96 05/15/2019 1130   BUN 11 05/15/2019 1130   CREATININE 0.70 05/15/2019 1130   CALCIUM 9.1 05/08/2019 1543   PROT 7.5 05/08/2019 1543   ALBUMIN 3.5 05/08/2019 1543   AST 25 05/08/2019 1543   ALT 30 05/08/2019 1543   ALKPHOS 55 05/08/2019 1543   BILITOT 0.3 05/08/2019 1543   GFRNONAA >60 05/08/2019 1543   GFRAA >60 05/08/2019 1543   Lab Results  Component Value Date   CHOL 139 03/26/2013   HDL 40 03/26/2013   LDLCALC 71 03/26/2013   TRIG 139 03/26/2013   CHOLHDL 3.5 03/26/2013   No results found for: HGBA1C No results found for: VITAMINB12 Lab Results  Component Value Date   TSH 1.980 10/13/2008    ASSESSMENT AND PLAN 39 y.o. year old female  has a past medical history of Anemia, Anxiety, Arnold-Chiari malformation ([place]) (02/11/2014), Depression, Hypertension, Migraine, and Obesity. here with:  1.  Migraine headache -Currently off medications, never started Topamax -Headaches on average about 2-week, well controlled with Excedrin Migraine -We will hold additional medications at this time, if headaches increase, may consider CGRP (low side effect profile, would choose Ajovy or Emgality, due to history of HTN), patient does have migraine features with headaches -MRI of the brain in October 2020 was normal, previous history of Arnold-Chiari was not  present on recent MRI  2.  Paresthesia, tingling/burning to  bilateral lower extremities from ankles to knees -Exam evaluation is normal, sensation is intact -Indicates began around the time of increasing Inderal LA 120 mg after 1 dose, she was slowly weaned off the medication over a few weeks -Seems unlikely related to beta-blocker, given that she was slowly weaned  -Will check a variety of blood work today (HIV, b. burgdorferi, ANA, RF factor, sed rate, multiple myeloma panel, TSH, CMP, B12) -Will order nerve conduction evaluation of lower extremities with Dr. Jannifer Franklin -I will see her back in 4 months or sooner if needed  I spent 30 minutes of face-to-face and non-face-to-face time with patient.  This included previsit chart review, lab review, study review, order entry, electronic health record documentation, patient education.  Butler Denmark, AGNP-C, DNP 12/14/2019, 9:04 AM [facility] [address], [place], [place] [address] [phone]

## 2019-12-14 NOTE — Patient Instructions (Signed)
Hold off on medications currently  Will order nerve conduction study with Dr. Anne Hahn Check blood work  See you back in 4 months

## 2019-12-14 NOTE — Progress Notes (Signed)
I have read the note, and I agree with the clinical assessment and plan.  Kiyra Slaubaugh K Daquon Greenleaf   

## 2019-12-16 ENCOUNTER — Encounter: Payer: Self-pay | Admitting: Neurology

## 2019-12-16 ENCOUNTER — Ambulatory Visit: Payer: Self-pay

## 2019-12-16 ENCOUNTER — Ambulatory Visit: Payer: Self-pay | Attending: Internal Medicine

## 2019-12-16 DIAGNOSIS — Z23 Encounter for immunization: Secondary | ICD-10-CM

## 2019-12-16 NOTE — Progress Notes (Signed)
   Covid-19 Vaccination Clinic  Name:  INDIYA IZQUIERDO    MRN: [id number] DOB: 02/16/1981  12/16/2019  Ms. Jaster was observed post Covid-19 immunization for 15 minutes without incident. She was provided with Vaccine Information Sheet and instruction to access the V-Safe system.   Ms. Farnell was instructed to call 911 with any severe reactions post vaccine: Marland Kitchen Difficulty breathing  . Swelling of face and throat  . A fast heartbeat  . A bad rash all over body  . Dizziness and weakness   Immunizations Administered    Name Date Dose VIS Date Route   Pfizer COVID-19 Vaccine 12/16/2019  2:45 PM 0.3 mL 09/15/2018 Intramuscular   Manufacturer: [employer], [employer]   Lot: [id number]   NDC: [address]-1

## 2019-12-17 LAB — COMPREHENSIVE METABOLIC PANEL
ALT: 19 IU/L (ref 0–32)
AST: 16 IU/L (ref 0–40)
Albumin/Globulin Ratio: 1.1 — ABNORMAL LOW (ref 1.2–2.2)
Albumin: 4 g/dL (ref 3.8–4.8)
Alkaline Phosphatase: 63 IU/L (ref 48–121)
BUN/Creatinine Ratio: 13 (ref 9–23)
BUN: 8 mg/dL (ref 6–20)
Bilirubin Total: 0.6 mg/dL (ref 0.0–1.2)
CO2: 23 mmol/L (ref 20–29)
Calcium: 9.3 mg/dL (ref 8.7–10.2)
Chloride: 102 mmol/L (ref 96–106)
Creatinine, Ser: 0.63 mg/dL (ref 0.57–1.00)
GFR calc Af Amer: 132 mL/min/{1.73_m2} (ref >59)
GFR calc non Af Amer: 114 mL/min/{1.73_m2} (ref >59)
Globulin, Total: 3.7 g/dL (ref 1.5–4.5)
Glucose: 87 mg/dL (ref 65–99)
Potassium: 3.4 mmol/L — ABNORMAL LOW (ref 3.5–5.2)
Sodium: 140 mmol/L (ref 134–144)
Total Protein: 7.7 g/dL (ref 6.0–8.5)

## 2019-12-17 LAB — MULTIPLE MYELOMA PANEL, SERUM
Albumin SerPl Elph-Mcnc: 3.4 g/dL (ref 2.9–4.4)
Albumin/Glob SerPl: 0.8 (ref 0.7–1.7)
Alpha 1: 0.3 g/dL (ref 0.0–0.4)
Alpha2 Glob SerPl Elph-Mcnc: 0.7 g/dL (ref 0.4–1.0)
B-Globulin SerPl Elph-Mcnc: 1.4 g/dL — ABNORMAL HIGH (ref 0.7–1.3)
Gamma Glob SerPl Elph-Mcnc: 2 g/dL — ABNORMAL HIGH (ref 0.4–1.8)
Globulin, Total: 4.3 g/dL — ABNORMAL HIGH (ref 2.2–3.9)
IgA/Immunoglobulin A, Serum: 408 mg/dL — ABNORMAL HIGH (ref 87–352)
IgG (Immunoglobin G), Serum: 1881 mg/dL — ABNORMAL HIGH (ref 586–1602)
IgM (Immunoglobulin M), Srm: 131 mg/dL (ref 26–217)

## 2019-12-17 LAB — SEDIMENTATION RATE: Sed Rate: 46 mm/hr — ABNORMAL HIGH (ref 0–32)

## 2019-12-17 LAB — B. BURGDORFI ANTIBODIES: Lyme IgG/IgM Ab: <0.91 {ISR} (ref 0.00–0.90)

## 2019-12-17 LAB — RHEUMATOID FACTOR: Rheumatoid fact SerPl-aCnc: 12.4 IU/mL (ref 0.0–13.9)

## 2019-12-17 LAB — TSH: TSH: 1.82 u[IU]/mL (ref 0.450–4.500)

## 2019-12-17 LAB — ANA W/REFLEX: Anti Nuclear Antibody (ANA): NEGATIVE

## 2019-12-17 LAB — VITAMIN B12: Vitamin B-12: 726 pg/mL (ref 232–1245)

## 2019-12-17 LAB — HIV ANTIBODY (ROUTINE TESTING W REFLEX): HIV Screen 4th Generation wRfx: NONREACTIVE

## 2019-12-22 ENCOUNTER — Telehealth: Payer: Self-pay | Admitting: *Deleted

## 2019-12-22 NOTE — Telephone Encounter (Signed)
-----   Message from Glean Salvo, NP sent at 12/21/2019 12:16 PM EDT ----- In addition, her potassium was slightly low 3.4, may need to discuss with PCP, possibly management of BP meds?

## 2019-12-22 NOTE — Telephone Encounter (Signed)
Spoke to pt and relayed  Labs show no significant abnormality (did review MM panel with Dr. Terrace Arabia, no M protein seen, slightly abnormalities of other levels, no significant concern). Continue with current plan for EMG. Dr. Anne Hahn may order further testing if felt indicated based on results. Also relayed the K slightly low 3.4 she is taking K+38meq po bid. She will be seeing her pcp for recheck soon.  She verbalized understanding. If questions when viewed in mychart will let us know.

## 2020-01-10 ENCOUNTER — Ambulatory Visit (INDEPENDENT_AMBULATORY_CARE_PROVIDER_SITE_OTHER): Payer: BC Managed Care – PPO | Admitting: Neurology

## 2020-01-10 ENCOUNTER — Other Ambulatory Visit: Payer: Self-pay

## 2020-01-10 ENCOUNTER — Ambulatory Visit: Payer: Self-pay | Attending: Internal Medicine

## 2020-01-10 ENCOUNTER — Encounter: Payer: BC Managed Care – PPO | Admitting: Neurology

## 2020-01-10 ENCOUNTER — Encounter (INDEPENDENT_AMBULATORY_CARE_PROVIDER_SITE_OTHER): Payer: BC Managed Care – PPO | Admitting: Neurology

## 2020-01-10 DIAGNOSIS — R202 Paresthesia of skin: Secondary | ICD-10-CM | POA: Diagnosis not present

## 2020-01-10 DIAGNOSIS — Z23 Encounter for immunization: Secondary | ICD-10-CM

## 2020-01-10 DIAGNOSIS — Z0289 Encounter for other administrative examinations: Secondary | ICD-10-CM

## 2020-01-10 NOTE — Procedures (Signed)
Full Name: Jeanette Santos Gender: Female MRN #: [id number] Date of Birth: 26-Jul-1980    Visit Date: 01/10/2020 08:02 Age: 39 Years Examining Physician: Levert Feinstein, MD  Referring Physician: Margie Ege, NP Height: 5 feet 3 inch History: 39 year old female complains of few months history of intermittent bilateral anterior shin area paresthesia, she denies significant low back pain, complains of upper back discomfort  On examination: Bilateral upper and lower extremity motor and sensory examinations were normal.  Deep tendon reflex were present and symmetric.  Summary of the tests: Nerve conduction study: Bilateral sural, superficial peroneal sensory responses were normal.  Bilateral tibial,  peroneal to EDB motor responses were normal.  Electromyography: Selected needle examination of right lower extremity muscles were normal.  Conclusion: This is a normal study.  There is no electrodiagnostic evidence of large fiber peripheral neuropathy.    ------------------------------- Levert Feinstein, M.D. PhD  [facility] [address] [place], [place] [address] Tel: [phone] Fax: [phone]  Verbal informed consent was obtained from the patient, patient was informed of potential risk of procedure, including bruising, bleeding, hematoma formation, infection, muscle weakness, muscle pain, numbness, among others.        MNC    Nerve / Sites Muscle Latency Ref. Amplitude Ref. Rel Amp Segments Distance Velocity Ref. Area    ms ms mV mV %  cm m/s m/s mVms  R Peroneal - EDB     Ankle EDB 4.0 ?6.5 3.3 ?2.0 100 Ankle - EDB 9   9.4     Fib head EDB 9.7  4.1  124 Fib head - Ankle 28 49 ?44 11.1     Pop fossa EDB 11.9  4.2  103 Pop fossa - Fib head 10 47 ?44 10.9         Pop fossa - Ankle      L Peroneal - EDB     Ankle EDB 4.1 ?6.5 5.8 ?2.0 100 Ankle - EDB 9   16.6     Fib head EDB 9.4  5.0  86.2 Fib head - Ankle 28 53 ?44 17.6     Pop fossa EDB 11.1   5.4  108 Pop fossa - Fib head 10 59 ?44 17.9         Pop fossa - Ankle      R Tibial - AH     Ankle AH 3.9 ?5.8 11.2 ?4.0 100 Ankle - AH 9   20.6     Pop fossa AH NR  NR  NR Pop fossa - Ankle 31 NR ?41 NR  L Tibial - AH     Ankle AH 3.9 ?5.8 12.8 ?4.0 100 Ankle - AH 9   21.8     Pop fossa AH NR  NR  NR Pop fossa - Ankle 32 NR ?41 NR             SNC    Nerve / Sites Rec. Site Peak Lat Ref.  Amp Ref. Segments Distance    ms ms V V  cm  R Sural - Ankle (Calf)     Calf Ankle 3.3 ?4.4 21 ?6 Calf - Ankle 14  L Sural - Ankle (Calf)     Calf Ankle 3.6 ?4.4 23 ?6 Calf - Ankle 14  R Superficial peroneal - Ankle     Lat leg Ankle 3.7 ?4.4 7 ?6 Lat leg - Ankle 14  L Superficial peroneal - Ankle  Lat leg Ankle 3.6 ?4.4 11 ?6 Lat leg - Ankle 14             F  Wave    Nerve F Lat Ref.   ms ms  R Tibial - AH 46.0 ?56.0  L Tibial - AH 46.7 ?56.0         EMG Summary Table    Spontaneous MUAP Recruitment  Muscle IA Fib PSW Fasc Other Amp Dur. Poly Pattern  R. Tibialis anterior Normal None None None _______ Normal Normal Normal Normal  R. Tibialis posterior Normal None None None _______ Normal Normal Normal Normal  R. Peroneus longus Normal None None None _______ Normal Normal Normal Normal  R. Gastrocnemius (Medial head) Normal None None None _______ Normal Normal Normal Normal  R. Vastus lateralis Normal None None None _______ Normal Normal Normal Normal  R. Abductor hallucis Normal None None None _______ Normal Normal Normal Normal

## 2020-01-10 NOTE — Progress Notes (Signed)
   Covid-19 Vaccination Clinic  Name:  [name]    MRN: [id number] DOB: 21-Mar-1981  01/10/2020  Ms. Bogdan was observed post Covid-19 immunization for 15 minutes without incident. She was provided with Vaccine Information Sheet and instruction to access the V-Safe system.   Ms. Donahoe was instructed to call 911 with any severe reactions post vaccine: Marland Kitchen Difficulty breathing  . Swelling of face and throat  . A fast heartbeat  . A bad rash all over body  . Dizziness and weakness   Immunizations Administered    Name Date Dose VIS Date Route   Pfizer COVID-19 Vaccine 01/10/2020  1:12 PM 0.3 mL 09/15/2018 Intramuscular   Manufacturer: [employer], [employer]   Lot: [id number]   NDC: [address]-6

## 2020-02-28 ENCOUNTER — Other Ambulatory Visit: Payer: Self-pay | Admitting: Orthopedic Surgery

## 2020-02-28 DIAGNOSIS — M542 Cervicalgia: Secondary | ICD-10-CM

## 2020-03-07 ENCOUNTER — Other Ambulatory Visit: Payer: Self-pay

## 2020-03-21 ENCOUNTER — Other Ambulatory Visit: Payer: Self-pay

## 2020-03-21 ENCOUNTER — Ambulatory Visit
Admission: RE | Admit: 2020-03-21 | Discharge: 2020-03-21 | Disposition: A | Discharge status: Home / Self Care | Payer: BC Managed Care – PPO | Source: Ambulatory Visit | Attending: Orthopedic Surgery | Admitting: Orthopedic Surgery

## 2020-03-21 DIAGNOSIS — M542 Cervicalgia: Secondary | ICD-10-CM

## 2020-04-19 ENCOUNTER — Ambulatory Visit: Payer: BC Managed Care – PPO | Admitting: Neurology

## 2020-05-01 ENCOUNTER — Encounter: Payer: Self-pay | Admitting: Neurology

## 2020-05-03 ENCOUNTER — Other Ambulatory Visit: Payer: BC Managed Care – PPO

## 2020-07-24 NOTE — Progress Notes (Signed)
PATIENT: Jeanette Santos DOB: Jan 29, 1981  REASON FOR VISIT: follow up HISTORY FROM: patient  HISTORY OF PRESENT ILLNESS: Today 07/25/20 Jeanette Santos is 40 year old female with history of obesity, hypertension, headache, mild Arnold-Chiari malformation, and tingling and burning in her lower extremities.  Labs showed no significant abnormality. NCV/EMG was normal.  Had Covid in October, since then, had an increase in headache, currently reporting headache every 2 days.  Describes as retro-orbital bilaterally, at temples.  With migraine features.  Has been taking daily Excedrin Migraine.  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  Due for eye exam the end of the month.  The tingling to her lower extremities has resolved, still has burning to her top thighs only at night, few times a week.  Has just started back exercising.  Takes gabapentin intermittently, for rotator cuff issues.  Since May, has lost 4 pounds.  Here today for evaluation unaccompanied. Has close PCP follow-up. Has fairly significant anxiety.  HISTORY  12/14/2019 SS: Jeanette Santos is a 40 year old female with history of obesity, hypertension, headache, and mild Arnold-Chiari malformation.,  She was on Inderal for headaches, dose was increased to 120 mg LA, indicated she experienced tingling and burning in her legs around that time, after taking 1 dose, she decreased the dose back to 60 mg, continue with symptoms, we slowly weaned her off the medication, over a few weeks.  She continues to complain of tingling and burning from her ankles to knees bilaterally, in the evenings.  Denies any changes to the balance or falls.  Does not keep her up at night.  She does not notice any symptoms during the day.  Denies history of diabetes.  She continues to lose weight, in the last 3 months, has lost an additional 13 pounds, down to 252 lbs.  She denies low back pain, does have some chronic neck, mid back pain.  No changes to bowels or  bladder.  For headaches, never started Topamax, stop baclofen.  When she came off Inderal, her blood pressure did go up, she had to start taking HCTZ daily, continue amlodipine.  Reports on average about 2 headaches a week, taking Excedrin Migraine, tension variety with good benefit.  With headaches, can have migraine features of photophobia, photophobia, and nausea. 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 She does not wish to start any new medications, says she is sensitive to medicines, she can be the " unicorn".  She indicates just last week, she started feeling like herself again, does still have the symptoms of paresthesia.  She presents today for evaluation unaccompanied.  REVIEW OF SYSTEMS: Out of a complete 14 system review of symptoms, the patient complains only of the following symptoms, and all other reviewed systems are negative.  headache  ALLERGIES: Allergies  Allergen Reactions  . Lisinopril Swelling  . Vicodin [Hydrocodone-Acetaminophen] Itching    HOME MEDICATIONS: Outpatient Medications Prior to Visit  Medication Sig Dispense Refill  . acetaminophen (TYLENOL) 500 MG tablet Take 500-1,000 mg by mouth every 6 (six) hours as needed for mild pain or headache.    Marland Kitchen amLODipine (NORVASC) 10 MG tablet Take 10 mg by mouth daily.    . ferrous sulfate 325 (65 FE) MG tablet Take 325 mg by mouth daily.    . hydrochlorothiazide (HYDRODIURIL) 25 MG tablet Take 12.5 mg by mouth as needed (depending on her numbers).     Marland Kitchen ibuprofen (ADVIL,MOTRIN) 200 MG tablet Take 200-400 mg by mouth every  6 (six) hours as needed (For pain.).     Marland Kitchen Multiple Vitamins-Minerals (ADULT GUMMY PO) Take 2 tablets by mouth daily. CHEW    . Multiple Vitamins-Minerals (HAIR/SKIN/NAILS/BIOTIN PO) Take 2 tablets by mouth daily.    . ondansetron (ZOFRAN-ODT) 8 MG disintegrating tablet Take 8 mg by mouth as needed.    . potassium chloride SA (KLOR-CON) 20 MEQ tablet Take 20 mEq by mouth once.    . vitamin C  (ASCORBIC ACID) 500 MG tablet Take 500-1,000 mg by mouth daily.    Marland Kitchen omeprazole (PRILOSEC) 40 MG capsule Take 40 mg by mouth daily. (Patient not taking: Reported on 07/25/2020)     No facility-administered medications prior to visit.    PAST MEDICAL HISTORY: Past Medical History:  Diagnosis Date  . Anemia   . Anxiety   . Arnold-Chiari malformation ([place]) 02/11/2014  . Depression   . Hypertension   . Migraine   . Obesity     PAST SURGICAL HISTORY: Past Surgical History:  Procedure Laterality Date  . NONE      FAMILY HISTORY: Family History  Problem Relation Age of Onset  . Hypertension Mother   . Depression Maternal Grandmother     SOCIAL HISTORY: Social History   Socioeconomic History  . Marital status: Single    Spouse name: Not on file  . Number of children: 0  . Years of education: COLLEGE  . Highest education level: Not on file  Occupational History    Employer: [employer]  Tobacco Use  . Smoking status: Never Smoker  . Smokeless tobacco: Never Used  Vaping Use  . Vaping Use: Never used  Substance and Sexual Activity  . Alcohol use: Yes    Comment: social   . Drug use: No  . Sexual activity: Yes    Birth control/protection: None  Other Topics Concern  . Not on file  Social History Narrative   R handed   Lives with fiance    1-2 cups Caffeine weekly    Social Determinants of Health   Financial Resource Strain: Not on file  Food Insecurity: Not on file  Transportation Needs: Not on file  Physical Activity: Not on file  Stress: Not on file  Social Connections: Not on file  Intimate Partner Violence: Not on file   PHYSICAL EXAM  Vitals:   07/25/20 0902  BP: 130/90  Pulse: 80  Weight: 248 lb 9.6 oz (112.8 kg)  Height: _0  (1.6 m)   Body mass index is 44.04 kg/m.  Generalized: Well developed, in no acute distress   Neurological examination  Mentation: Alert oriented to time, place, history taking. Follows all commands speech and language  fluent Cranial nerve II-XII: Pupils were equal round reactive to light. Extraocular movements were full, visual field were full on confrontational test. Facial sensation and strength were normal.  Head turning and shoulder shrug  were normal and symmetric. Motor: The motor testing reveals 5 over 5 strength of all 4 extremities. Good symmetric motor tone is noted throughout.  Sensory: Sensory testing is intact to soft touch on all 4 extremities. No evidence of extinction is noted.  Coordination: Cerebellar testing reveals good finger-nose-finger and heel-to-shin bilaterally.  Gait and station: Gait is normal. Tandem gait is normal. Romberg is negative. No drift is seen.  Reflexes: Deep tendon reflexes are symmetric and normal bilaterally.   DIAGNOSTIC DATA (LABS, IMAGING, TESTING) - I reviewed patient records, labs, notes, testing and imaging myself where available.  Lab Results  Component Value Date   WBC 6.9 05/05/2019   HGB 15.0 05/15/2019   HCT 44.0 05/15/2019   MCV 88.4 05/05/2019   PLT 387 05/05/2019      Component Value Date/Time   NA 140 12/14/2019 0908   K 3.4 (L) 12/14/2019 0908   CL 102 12/14/2019 0908   CO2 23 12/14/2019 0908   GLUCOSE 87 12/14/2019 0908   GLUCOSE 96 05/15/2019 1130   BUN 8 12/14/2019 0908   CREATININE 0.63 12/14/2019 0908   CALCIUM 9.3 12/14/2019 0908   PROT 7.7 12/14/2019 0908   ALBUMIN 4.0 12/14/2019 0908   AST 16 12/14/2019 0908   ALT 19 12/14/2019 0908   ALKPHOS 63 12/14/2019 0908   BILITOT 0.6 12/14/2019 0908   GFRNONAA 114 12/14/2019 0908   GFRAA 132 12/14/2019 0908   Lab Results  Component Value Date   CHOL 139 03/26/2013   HDL 40 03/26/2013   LDLCALC 71 03/26/2013   TRIG 139 03/26/2013   CHOLHDL 3.5 03/26/2013   No results found for: HGBA1C Lab Results  Component Value Date   VITAMINB12 726 12/14/2019   Lab Results  Component Value Date   TSH 1.820 12/14/2019    ASSESSMENT AND PLAN 40 y.o. year old female  has a past  medical history of Anemia, Anxiety, Arnold-Chiari malformation ([place]) (02/11/2014), Depression, Hypertension, Migraine, and Obesity. here with:  1. Migraine headache -Likely component of rebound headache with frequent Excedrin Migraine use, I have asked her to cut this back, no more than twice a week -Start Topamax working up to 50 mg at bedtime for migraine prevention (Took Topamax when previously prescribed, few months back, helped) -May consider CGRP in the future  2.  Paresthesia, tingling/burning to bilateral lower extremities from ankles to knees (tingling resolved, still occasional burning to the thighs at bedtime) -NCV/EMG was normal -Will recheck MM panel, ESR, mildly elevated at last visit -Follow-up in 6 months or sooner if needed, will let he see Dr. Jannifer Franklin next  I spent 30 minutes of face-to-face and non-face-to-face time with patient.  This included previsit chart review, lab review, study review, order entry, electronic health record documentation, patient education.  Butler Denmark, AGNP-C, DNP 07/25/2020, 9:40 AM [facility] [address], [place], [place] [address] [phone]

## 2020-07-25 ENCOUNTER — Ambulatory Visit (INDEPENDENT_AMBULATORY_CARE_PROVIDER_SITE_OTHER): Payer: BC Managed Care – PPO | Admitting: Neurology

## 2020-07-25 ENCOUNTER — Encounter: Payer: Self-pay | Admitting: Neurology

## 2020-07-25 VITALS — BP 130/90 | HR 80 | Ht 63.0 in | Wt 248.6 lb

## 2020-07-25 DIAGNOSIS — R519 Headache, unspecified: Secondary | ICD-10-CM | POA: Diagnosis not present

## 2020-07-25 DIAGNOSIS — R202 Paresthesia of skin: Secondary | ICD-10-CM

## 2020-07-25 MED ORDER — TOPIRAMATE 25 MG PO TABS: ORAL_TABLET | ORAL | 5 refills | Status: DC

## 2020-07-25 NOTE — Progress Notes (Signed)
I have read the note, and I agree with the clinical assessment and plan.  [name] [name]   

## 2020-07-25 NOTE — Patient Instructions (Signed)
Recheck labs today  Restart topamax working up to 50 mg at bedtime Cut back on the excedrin migraine  See you back in 6 months

## 2020-07-26 LAB — MULTIPLE MYELOMA PANEL, SERUM
Albumin SerPl Elph-Mcnc: 3.4 g/dL (ref 2.9–4.4)
Albumin/Glob SerPl: 1 (ref 0.7–1.7)
Alpha 1: 0.2 g/dL (ref 0.0–0.4)
Alpha2 Glob SerPl Elph-Mcnc: 0.6 g/dL (ref 0.4–1.0)
B-Globulin SerPl Elph-Mcnc: 1.1 g/dL (ref 0.7–1.3)
Gamma Glob SerPl Elph-Mcnc: 1.7 g/dL (ref 0.4–1.8)
Globulin, Total: 3.6 g/dL (ref 2.2–3.9)
IgA/Immunoglobulin A, Serum: 418 mg/dL — ABNORMAL HIGH (ref 87–352)
IgG (Immunoglobin G), Serum: 1747 mg/dL — ABNORMAL HIGH (ref 586–1602)
IgM (Immunoglobulin M), Srm: 123 mg/dL (ref 26–217)
Total Protein: 7 g/dL (ref 6.0–8.5)

## 2020-07-26 LAB — SEDIMENTATION RATE: Sed Rate: 42 mm/hr — ABNORMAL HIGH (ref 0–32)

## 2020-07-27 ENCOUNTER — Encounter: Payer: Self-pay | Admitting: Neurology

## 2020-10-10 ENCOUNTER — Encounter: Payer: Self-pay | Admitting: Neurology

## 2020-12-08 ENCOUNTER — Other Ambulatory Visit (HOSPITAL_BASED_OUTPATIENT_CLINIC_OR_DEPARTMENT_OTHER): Payer: Self-pay

## 2020-12-08 ENCOUNTER — Ambulatory Visit: Payer: BC Managed Care – PPO

## 2021-01-05 ENCOUNTER — Ambulatory Visit: Payer: BC Managed Care – PPO

## 2021-01-24 ENCOUNTER — Encounter: Payer: Self-pay | Admitting: Neurology

## 2021-01-24 ENCOUNTER — Ambulatory Visit (INDEPENDENT_AMBULATORY_CARE_PROVIDER_SITE_OTHER): Payer: BC Managed Care – PPO | Admitting: Neurology

## 2021-01-24 VITALS — BP 110/84 | HR 85 | Ht 63.0 in | Wt 225.4 lb

## 2021-01-24 DIAGNOSIS — Q0700 Arnold-Chiari syndrome without spina bifida or hydrocephalus: Secondary | ICD-10-CM

## 2021-01-24 DIAGNOSIS — R519 Headache, unspecified: Secondary | ICD-10-CM | POA: Diagnosis not present

## 2021-01-24 MED ORDER — TOPIRAMATE 100 MG PO TABS: 100.0000 mg | ORAL_TABLET | Freq: Every day | ORAL | 2 refills | Status: AC

## 2021-01-24 NOTE — Progress Notes (Signed)
Reason for visit: Migraine headache  Jeanette Santos is an 40 y.o. female  History of present illness:  Jeanette Santos is a 40 year old right-handed black female with a history of migraine headaches and obesity.  The patient has been on Topamax with good improvement.  Her headache frequency has significantly reduced, and she now has headaches only around her menstrual cycle.  She will have 2 or 3 days with headache and then the rest of the month she feels well.  She takes Tylenol if needed for the headache, in the past she has not responded to Imitrex or Maxalt.  The patient works from home, occasionally she may have to lay down with the headache, but she does not miss a whole day of work because of the headache event.  She may have some nausea but no vomiting.  She takes gabapentin 300 mg 3 times daily for burning sensations in her muscles that have been present since she had a COVID virus infection.  She returns to the office today for an evaluation.  Past Medical History:  Diagnosis Date   Anemia    Anxiety    Arnold-Chiari malformation ([facility]) 02/11/2014   Depression    Hypertension    Migraine    Obesity     Past Surgical History:  Procedure Laterality Date   NONE      Family History  Problem Relation Age of Onset   Hypertension Mother    Depression Maternal Grandmother     Social history:  reports that she has never smoked. She has never used smokeless tobacco. She reports current alcohol use. She reports that she does not use drugs.    Allergies  Allergen Reactions   Lisinopril Swelling   Vicodin [Hydrocodone-Acetaminophen] Itching    Medications:  Prior to Admission medications   Medication Sig Start Date End Date Taking? Authorizing Provider  acetaminophen (TYLENOL) 500 MG tablet Take 500-1,000 mg by mouth every 6 (six) hours as needed for mild pain or headache.   Yes [provider]  amLODipine (NORVASC) 10 MG tablet Take 10 mg by mouth daily.   Yes  [provider]  ferrous sulfate 325 (65 FE) MG tablet Take 325 mg by mouth daily. 02/05/19  Yes [provider]  gabapentin (NEURONTIN) 300 MG capsule Take 300 mg by mouth 3 (three) times daily as needed. 01/01/21  Yes [provider]  hydrochlorothiazide (HYDRODIURIL) 25 MG tablet Take 12.5 mg by mouth as needed (depending on her numbers).    Yes [provider]  Multiple Vitamins-Minerals (ADULT GUMMY PO) Take 2 tablets by mouth daily. CHEW   Yes [provider]  Multiple Vitamins-Minerals (HAIR/SKIN/NAILS/BIOTIN PO) Take 2 tablets by mouth daily.   Yes [provider]  omeprazole (PRILOSEC) 40 MG capsule Take 40 mg by mouth daily. 06/08/19  Yes [provider]  potassium chloride SA (KLOR-CON) 20 MEQ tablet Take 20 mEq by mouth once. 06/10/19  Yes [provider]  topiramate (TOPAMAX) 25 MG tablet Take 1 tablet at bedtime for 1 week, then take 2 tablets 07/25/20  Yes Glean Salvo, NP    ROS:  Out of a complete 14 system review of symptoms, the patient complains only of the following symptoms, and all other reviewed systems are negative.  Headache Burning in the muscles  Blood pressure 110/84, pulse 85, height 5\' 3"  (1.6 m), weight 225 lb 6 oz (102.2 kg), SpO2 97 %.  Physical Exam  General: The patient is alert  and cooperative at the time of the examination.  The patient is markedly obese.  Respiratory: Lung fields are clear.  Cardiovascular: Regular rate and rhythm, no obvious murmurs or rubs are noted.  Skin: No significant peripheral edema is noted.   Neurologic Exam  Mental status: The patient is alert and oriented x 3 at the time of the examination. The patient has apparent normal recent and remote memory, with an apparently normal attention span and concentration ability.   Cranial nerves: Facial symmetry is present. Speech is normal, no aphasia or dysarthria is noted. Extraocular movements are full.  Visual fields are full.  Motor: The patient has good strength in all 4 extremities.  Sensory examination: Soft touch sensation is symmetric on the face, arms, and legs.  Coordination: The patient has good finger-nose-finger and heel-to-shin bilaterally.  Gait and station: The patient has a normal gait. Tandem gait is normal. Romberg is negative. No drift is seen.  Reflexes: Deep tendon reflexes are symmetric.   MRI brain 05/19/19:  IMPRESSION:   Unremarkable MRI brain (without). No acute findings.  * MRI scan images were reviewed online. I agree with the written report.   MRI cervical 03/21/20:  IMPRESSION: 1. No acute findings or explanation for the patient's symptoms. 2. Stable small central disc protrusion at C5-6 without cord deformity or foraminal compromise.    Assessment/Plan:  1.  Menstrual migraine  The patient is doing well with her headaches but is still having 2 to 3 days with headache around the menstrual cycle.  We will increase the Topamax to 75 mg at night for 1 week and then go to 100 mg at night.  A prescription for the 100 mg Topamax tablets was sent in.  The patient will follow up here in 6 months, in the future she may be followed through the new headache specialist.  Jeanette Palau MD 01/24/2021 9:12 AM  [facility] [address] [place], [place] [address]  Phone [phone] Fax [phone]

## 2021-01-24 NOTE — Patient Instructions (Signed)
We will go up on the topamax to 75 mg at night for one week, then convert to the 100 mg tablets taking one a day.  Topamax (topiramate) is a seizure medication that has an FDA approval for seizures and for migraine headache. Potential side effects of this medication include weight loss, cognitive slowing, tingling in the fingers and toes, and carbonated drinks will taste bad. If any significant side effects are noted on this drug, please contact our office.

## 2021-07-31 ENCOUNTER — Encounter: Payer: Self-pay | Admitting: Neurology

## 2021-07-31 NOTE — Telephone Encounter (Signed)
Called pt to r/s upcoming 1/16 appt Maralyn Sago out) and pt stated she was feeling better. R/s her to 1/12 with Dr. Delena Bali to establish care as recommended by Dr. Anne Hahn previously.

## 2021-08-02 ENCOUNTER — Ambulatory Visit: Payer: BC Managed Care – PPO | Admitting: Psychiatry

## 2021-08-02 NOTE — Progress Notes (Deleted)
° °  CC:  headaches  Follow-up Visit  Last visit: 01/24/21  Brief HPI: 41 year old female with a history of*** who follows in clinic for migraines.  At her last visit, Topamax was increased to 100 mg QHS.  Interval History: ***   Headache days per month: *** Headache free days per month: *** Headache severity: ***  Current Headache Regimen: Preventative: *** Abortive: ***  # of doses of abortive medications per month: ***  Prior Therapies                                  Topamax 100 mg QHS Gabapentin 300 mg TID Propranolol 120 mg daily Imitrex - lack of efficacy Maxalt 10 mg PRN - lack of efficacy Tylenol  Physical Exam:   Vital Signs: There were no vitals taken for this visit. GENERAL:  well appearing, in no acute distress, alert  SKIN:  Color, texture, turgor normal. No rashes or lesions HEAD:  Normocephalic/atraumatic. RESP: normal respiratory effort MSK:  No gross joint deformities.   NEUROLOGICAL: Mental Status: Alert, oriented to person, place and time, Follows commands, and Speech fluent and appropriate. Cranial Nerves: PERRL, face symmetric, no dysarthria, hearing grossly intact Motor: moves all extremities equally Gait: normal-based.  IMPRESSION: ***  PLAN: ***   Follow-up: ***  I spent a total of *** minutes on the date of the service. Headache education was done. Discussed lifestyle modification including increased oral hydration, decreased caffeine, exercise and stress management. Discussed treatment options including preventive and acute medications, natural supplements, and infusion therapy. Discussed medication overuse headache and to limit use of acute treatments to no more than 2 days/week or 10 days/month. Discussed medication side effects, adverse reactions and drug interactions. Written educational materials and patient instructions outlining all of the above were given.  Genia Harold, MD

## 2021-08-06 ENCOUNTER — Ambulatory Visit: Payer: BC Managed Care – PPO | Admitting: Psychiatry

## 2021-08-06 ENCOUNTER — Ambulatory Visit: Payer: BC Managed Care – PPO | Admitting: Neurology

## 2021-08-08 ENCOUNTER — Encounter: Payer: Self-pay | Admitting: Psychiatry

## 2021-08-08 ENCOUNTER — Other Ambulatory Visit: Payer: Self-pay

## 2021-08-08 ENCOUNTER — Ambulatory Visit (INDEPENDENT_AMBULATORY_CARE_PROVIDER_SITE_OTHER): Payer: BC Managed Care – PPO | Admitting: Psychiatry

## 2021-08-08 VITALS — BP 135/95 | HR 86 | Ht 63.0 in | Wt 263.0 lb

## 2021-08-08 DIAGNOSIS — G43109 Migraine with aura, not intractable, without status migrainosus: Secondary | ICD-10-CM | POA: Diagnosis not present

## 2021-08-08 MED ORDER — NURTEC 75 MG PO TBDP: 75.0000 mg | ORAL_TABLET | ORAL | 3 refills | Status: DC | PRN

## 2021-08-08 MED ORDER — GABAPENTIN 300 MG PO CAPS: ORAL_CAPSULE | ORAL | 2 refills | Status: DC

## 2021-08-08 NOTE — Patient Instructions (Addendum)
Increase gabapentin to 300 mg in the morning, 300 mg in the afternoon, and 600 mg in the evening. After a week can increase to 600 mg in the morning, 300 mg in the afternoon, and 600 mg in the evening. After a week can increase to 600 mg three times a day Start Nurtec as needed for migraines. Take one pill as needed at the onset of migraine. Can repeat a dose in 24 hours if headache persists

## 2021-08-08 NOTE — Progress Notes (Signed)
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  At her last visit Topamax was increased to 100 mg QHS.  Interval History: She stopped taking Topamax 2 months ago due to mood and memory issues. She is still taking gabapentin 300 mg TID for burning sensations which developed after a COVID infection. Burning sensations have improved but are still present and can interfere with her sleeping.  Had a severe migraine last week which was associated with numbness over her right mouth. Migraine lasted for an entire week. Migraines are worse around her menstrual cycle. They typically start in the back of her head and radiate forward.  Headache days per month: 7 Headache free days per month: 23  Current Headache Regimen: Preventative: gabapentin 300 mg TID Abortive: Excedrin   Prior Therapies                                  Topamax 100 mg QHS Gabapentin 300 mg TID Propranolol 120 mg daily lisinopril Maxalt 10 mg PRN - drowsiness Imitrex - paresthesias  Physical Exam:   Vital Signs: BP (!) 135/95    Pulse 86    Ht 5\' 3"  (1.6 m)    Wt 263 lb (119.3 kg)    BMI 46.59 kg/m  GENERAL:  well appearing, in no acute distress, alert  SKIN:  Color, texture, turgor normal. No rashes or lesions HEAD:  Normocephalic/atraumatic. RESP: normal respiratory effort MSK:  No gross joint deformities. No tenderness to palpation over temples, shoulders, or neck.  NEUROLOGICAL: Mental Status: Alert, oriented to person, place and time, Follows commands, and Speech fluent and appropriate. Cranial Nerves: PERRL, face symmetric, no dysarthria, hearing grossly intact Motor: moves all extremities equally Gait: normal-based.  IMPRESSION: 41 year old female who presents for follow up of migraines. She has had relatively low frequency of migraines even off of Topamax, however migraines can be severe when  they do occur. Will increase gabapentin to help with headaches and burning sensation in her back. Nurtec started for rescue as she has failed 2 triptans due to side effects.  PLAN: -Preventive: Increase gabapentin to 300/300/600, can increase by 300 mg weekly up to 600 mg TID -Rescue: Start Nurtec 75 mg PRN   Follow-up: 6 months - 1 year  I spent a total of 28 minutes on the date of the service. Headache education was done. Discussed treatment options including preventive and acute medications. Discussed medication side effects, adverse reactions and drug interactions. Written educational materials and patient instructions outlining all of the above were given.  24, MD 08/08/21 9:28 AM

## 2021-08-17 ENCOUNTER — Encounter: Payer: Self-pay | Admitting: Psychiatry

## 2021-08-20 ENCOUNTER — Telehealth: Payer: Self-pay | Admitting: *Deleted

## 2021-08-20 NOTE — Telephone Encounter (Signed)
Attempted PA on CMM, no eligibility found. Called [employer] [place] provider [employer], spoke with Darl Pikes, [employer] but didn't find eligibility. She gave Ezequiel Essex  [phone] to find out if she has separate pharmacy card.  I called [facility], spoke with Revonda Standard who stated Nurtec is approved under [facility] but has really high co pay. I will advise patient to use savings card.

## 2021-11-06 ENCOUNTER — Other Ambulatory Visit: Payer: Self-pay | Admitting: Psychiatry

## 2022-02-06 ENCOUNTER — Encounter: Payer: Self-pay | Admitting: Psychiatry

## 2022-02-06 ENCOUNTER — Ambulatory Visit (INDEPENDENT_AMBULATORY_CARE_PROVIDER_SITE_OTHER): Payer: BC Managed Care – PPO | Admitting: Psychiatry

## 2022-02-06 VITALS — BP 131/83 | HR 88 | Ht 63.0 in | Wt 282.0 lb

## 2022-02-06 DIAGNOSIS — G43119 Migraine with aura, intractable, without status migrainosus: Secondary | ICD-10-CM | POA: Diagnosis not present

## 2022-02-06 MED ORDER — METHOCARBAMOL 500 MG PO TABS: 500.0000 mg | ORAL_TABLET | Freq: Every evening | ORAL | 6 refills | Status: AC | PRN

## 2022-02-06 MED ORDER — GABAPENTIN 300 MG PO CAPS: 300.0000 mg | ORAL_CAPSULE | Freq: Three times a day (TID) | ORAL | 2 refills | Status: DC

## 2022-02-06 MED ORDER — DULOXETINE HCL 30 MG PO CPEP: 30.0000 mg | ORAL_CAPSULE | Freq: Every day | ORAL | 6 refills | Status: DC

## 2022-02-06 NOTE — Patient Instructions (Addendum)
Decrease gabapentin to 300 mg three times a day Start Cymbalta 30 mg daily Take methocarbamol as needed for muscle spasms [email or web address] for copay card

## 2022-02-06 NOTE — Progress Notes (Signed)
   CC:  headaches  Follow-up Visit  Last visit: 08/08/21  Brief HPI: 41 year old female with a history of TMJ who follows in clinic for migraine headaches.  At her last visit gabapentin was increased to 300/300/600 and she was started on Nurtec for rescue.  Interval History: Headache frequency is about the same as her last visit but severity has improved. She currently gets ~2 headaches per week. Headaches are worse around her cycle. Takes Tylenol as needed which usually helps. She did not pick up Nurtec because her copay was too high. She is taking gabapentin 300/300/600 and thinks this higher dose may be affecting her memory.   Has been experiencing burning pain in her mid-back recently. This has been interfering with her sleep.  Headache days per month: 8 Headache free days per month: 22  Current Headache Regimen: Preventative: gabapentin 300/300/600 Abortive: Tylenol  Prior Therapies                                  Topamax 100 mg QHS - mood and memory issues Gabapentin 300/300/600 - memory issues at higher doses Propranolol 120 mg daily lisinopril Maxalt 10 mg PRN - drowsiness Imitrex - paresthesias Nurtec    Physical Exam:   Vital Signs: BP 131/83   Pulse 88   Ht 5\' 3"  (1.6 m)   Wt 282 lb (127.9 kg)   BMI 49.95 kg/m  GENERAL:  well appearing, in no acute distress, alert  SKIN:  Color, texture, turgor normal. No rashes or lesions HEAD:  Normocephalic/atraumatic. RESP: normal respiratory effort MSK:  No gross joint deformities.   NEUROLOGICAL: Mental Status: Alert, oriented to person, place and time, Follows commands, and Speech fluent and appropriate. Cranial Nerves: PERRL, face symmetric, no dysarthria, hearing grossly intact Motor: moves all extremities equally Gait: normal-based.  IMPRESSION: 41 year old female with a history of TMJ who presents for follow up of migraines. Her headaches have improved slightly with gabapentin but the higher dose is  affecting her memory. Will decrease back down to 300 mg TID and start Cymbalta for migraines and nerve pain. Provided information for Nurtec savings card to help reduce her copay. Will start Robaxin as needed for muscle spasms in her neck and back.  PLAN: -Prevention: Decrease gabapentin to 300 mg TID, start Cymbalta 30 mg daily -Rescue: Nurtec savings card information provided -Start Robaxin 500 mg PRN for muscle spasms -next steps: consider naratriptan PRN if unable to afford Nurtec with savings card   Follow-up: 6 months  I spent a total of 26 minutes on the date of the service. Headache education was done. Discussed treatment options including preventive and acute medications. Discussed medication side effects, adverse reactions and drug interactions. Written educational materials and patient instructions outlining all of the above were given.  41, MD 02/06/22 9:18 AM

## 2022-02-14 ENCOUNTER — Other Ambulatory Visit: Payer: Self-pay | Admitting: Psychiatry

## 2022-03-01 ENCOUNTER — Other Ambulatory Visit: Payer: Self-pay | Admitting: Psychiatry

## 2022-04-12 IMAGING — MR MR CERVICAL SPINE W/O CM
4 of 5 series · 27 of 48 positions shown · non-contrast
Comparison: Cervical MRI 03/26/2015

CLINICAL DATA: Nonradiating pain in the lower neck and upper
thoracic spine for 3 months. No acute injury or prior relevant
surgery.

EXAM:
MRI CERVICAL SPINE WITHOUT CONTRAST
TECHNIQUE: Multiplanar, multisequence MR imaging of the cervical spine was
performed. No intravenous contrast was administered.

[Series 5: T2 · sagittal · 3.0mm · 0.55mm/px · 4 of 8 slices shown (1 of 2)]
[im 1/8]
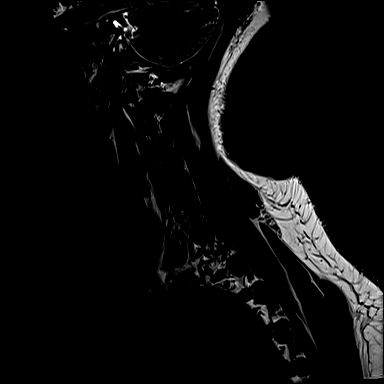
[im 3/8]
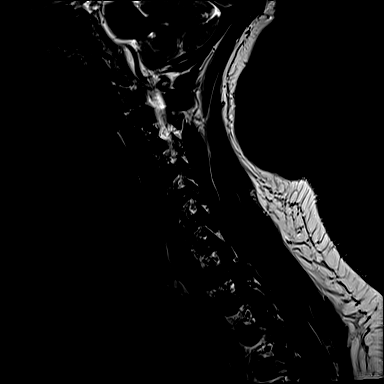
[im 5/8]
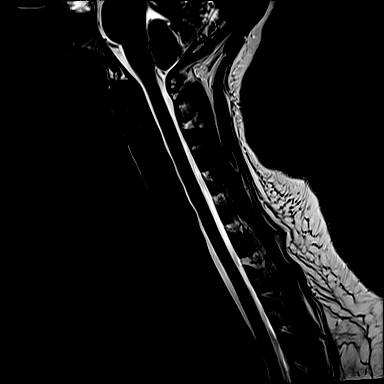
[im 8/8]
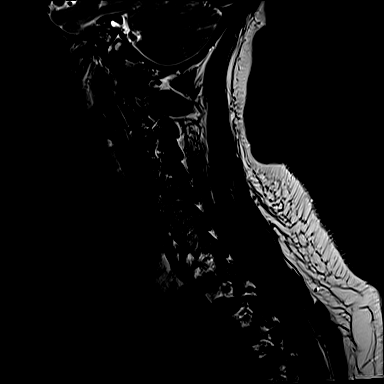

[Series 6: T1 · sagittal · 3.0mm · 0.66mm/px · 7 of 15 slices shown]
[im 1/15]
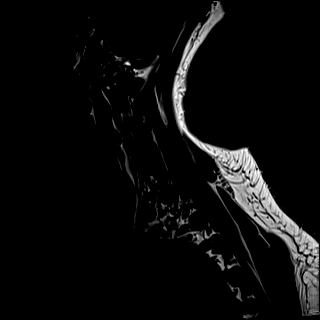
[im 3/15]
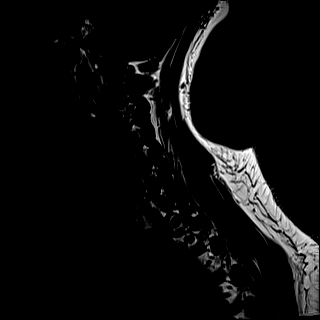
[im 5/15]
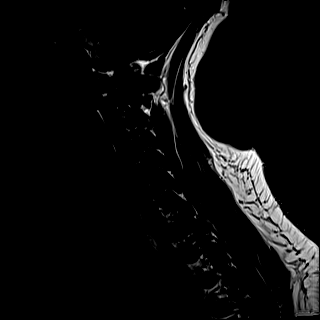
[im 8/15]
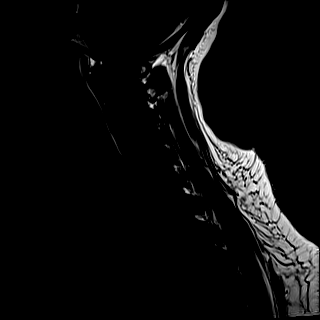
[im 10/15]
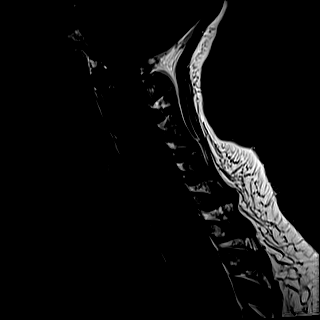
[im 12/15]
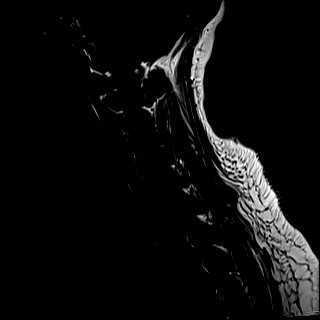
[im 15/15]
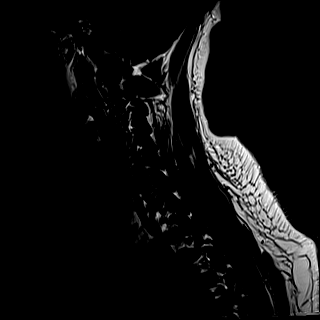

[Series 7: STIR · sagittal · 3.0mm · 0.33mm/px · 7 of 15 slices shown]
[im 1/15]
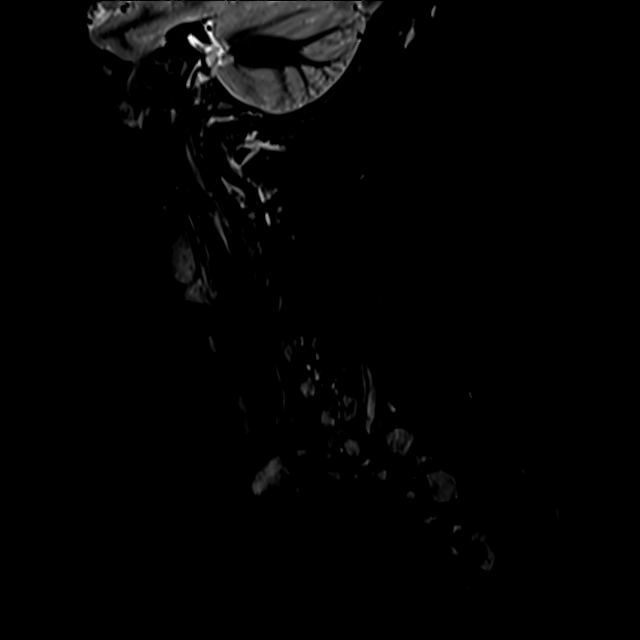
[im 3/15]
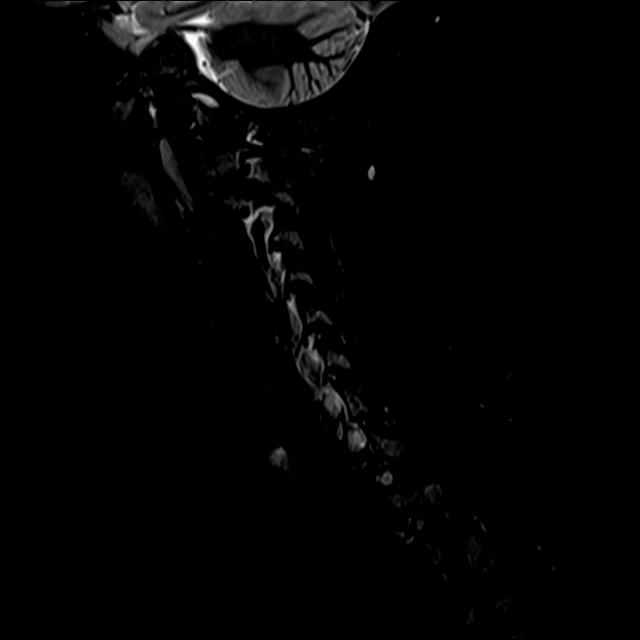
[im 5/15]
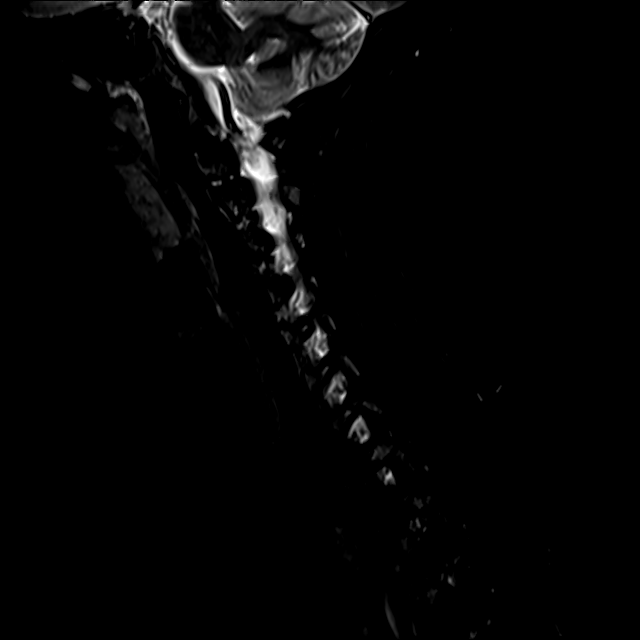
[im 8/15]
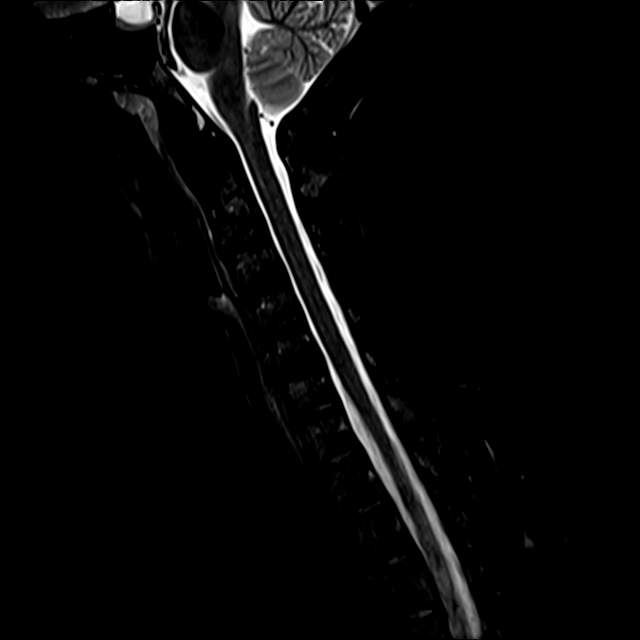
[im 10/15]
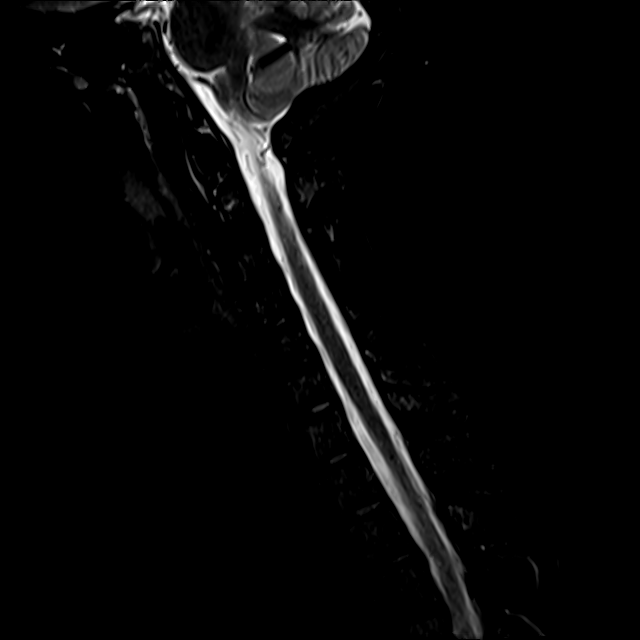
[im 12/15]
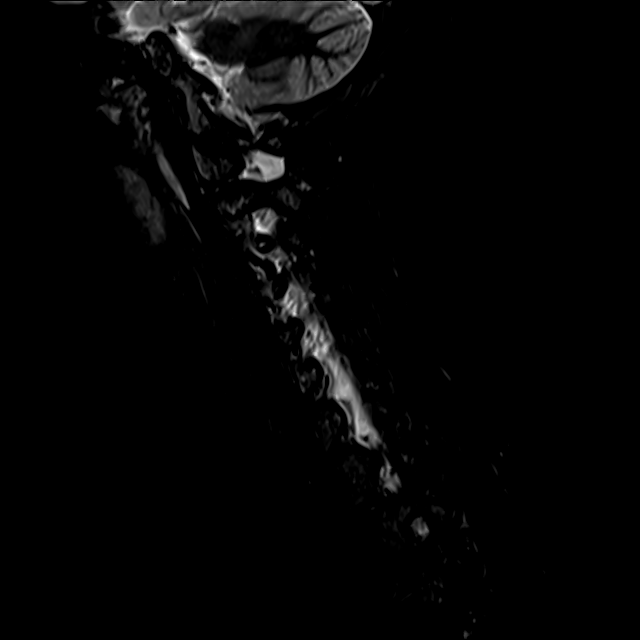
[im 15/15]
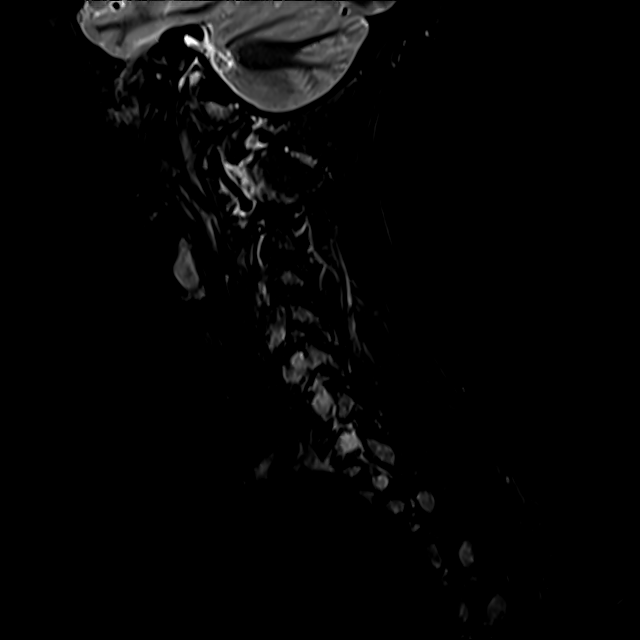

[Series 8: T2 · axial · 3.0mm · 0.50mm/px · z∈[-25,+63]mm · 9 of 30 slices shown (2 of 2)]
[im 1/30]
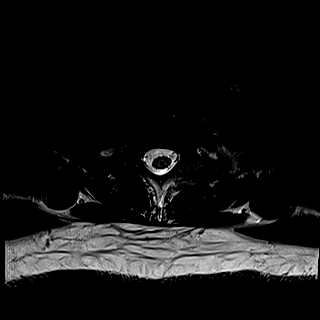
[im 5/30]
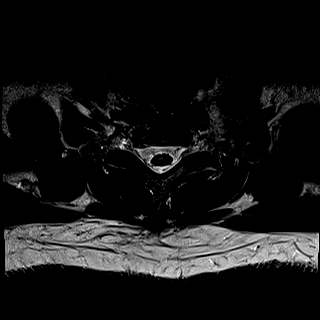
[im 9/30]
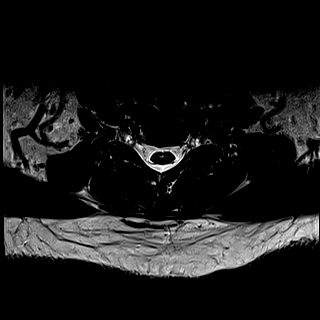
[im 13/30]
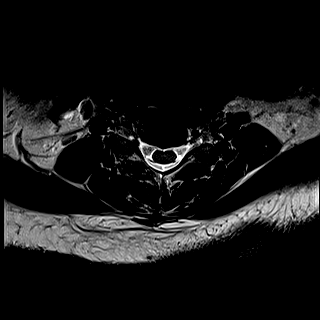
[im 15/30]
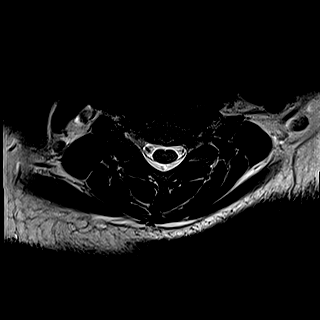
[im 17/30]
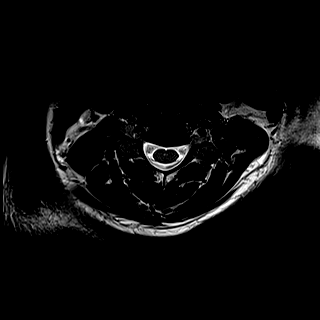
[im 21/30]
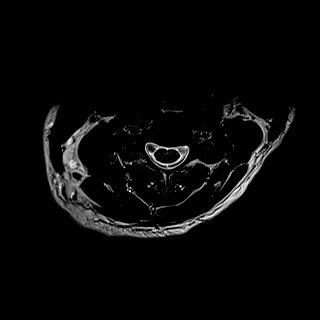
[im 25/30]
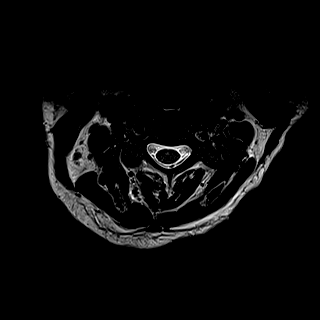
[im 30/30]
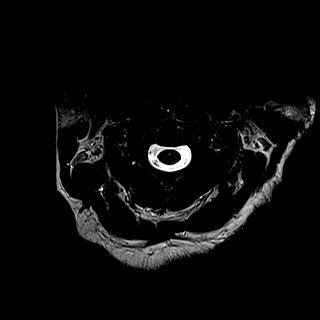

[27 of 48 positions shown; findings below may reference images not displayed]

FINDINGS: Alignment: Straightening without focal angulation or listhesis.

Vertebrae: No acute or suspicious osseous findings.

Cord: Normal in signal and caliber.

Posterior Fossa, vertebral arteries, paraspinal tissues: Visualized
portions of the posterior fossa appear unremarkable. Partially empty
sella turcica is unchanged. No significant paraspinal findings.
Bilateral vertebral artery flow voids.

Disc levels:

No significant disc space findings at or above C4-5. The spinal
canal and neural foramina are widely patent.

C5-6: Stable small central disc protrusion without resulting cord
deformity or foraminal compromise.

C6-7: Normal interspace.

C7-T1: Normal interspace.
IMPRESSION: 1. No acute findings or explanation for the patient's symptoms.
2. Stable small central disc protrusion at C5-6 without cord
deformity or foraminal compromise.

## 2022-05-31 ENCOUNTER — Other Ambulatory Visit: Payer: Self-pay | Admitting: Psychiatry

## 2022-09-07 ENCOUNTER — Other Ambulatory Visit: Payer: Self-pay | Admitting: Psychiatry

## 2022-09-09 ENCOUNTER — Ambulatory Visit (INDEPENDENT_AMBULATORY_CARE_PROVIDER_SITE_OTHER): Payer: BC Managed Care – PPO | Admitting: Psychiatry

## 2022-09-09 ENCOUNTER — Encounter: Payer: Self-pay | Admitting: Psychiatry

## 2022-09-09 VITALS — BP 110/78 | HR 91 | Ht 63.0 in | Wt 292.4 lb

## 2022-09-09 DIAGNOSIS — G8929 Other chronic pain: Secondary | ICD-10-CM

## 2022-09-09 DIAGNOSIS — G43119 Migraine with aura, intractable, without status migrainosus: Secondary | ICD-10-CM | POA: Diagnosis not present

## 2022-09-09 DIAGNOSIS — M546 Pain in thoracic spine: Secondary | ICD-10-CM

## 2022-09-09 MED ORDER — NARATRIPTAN HCL 2.5 MG PO TABS: 2.5000 mg | ORAL_TABLET | ORAL | 6 refills | Status: AC | PRN

## 2022-09-09 NOTE — Progress Notes (Signed)
   CC:  headaches  Follow-up Visit  Last visit: 02/06/22  Brief HPI: 42 year old female with a history of TMJ, HTN who follows in clinic for migraine headaches.   At her last visit she was started on Cymbalta for migraine prevention. She was started on Robaxin PRN for muscle spasms. Nurtec savings card was provided.  Interval History: Headaches have improved since her last visit. She now mostly has migraines a few times per month with her menstrual cycle. She has had 3 debilitating headaches since her last visit. She is tolerating gabapentin 600 mg TID well without side effects. Cymbalta caused side effects, so she stopped it. She was not able to pick up Nurtec due to cost. She has been taking Robaxin as needed at bedtime which does help somewhat.   She continues to have daily tension and burning pain in her mid-back. She will generally wake up with this, then have some improvement after her afternoon dose of gabapentin. She does sit at work all day and suspects this may be contributing to her pain. Gets massages which she finds helpful.  Migraine days per month: 3 Headache free days per month: 27  Current Headache Regimen: Preventative: gabapentin 600 mg TID Abortive: robaxin 500 mg PRN   Prior Therapies                                  Prevention: Topamax 100 mg QHS - mood and memory issues Gabapentin 300/300/600 - memory issues at higher doses Propranolol 120 mg daily Lisinopril Amlodipine 10 mg daily Cymbalta 30 mg daily - side effects  Rescue: Maxalt 10 mg PRN - drowsiness Imitrex - paresthesias Nurtec - unable to afford Robaxin 500 mg PRN  Physical Exam:   Vital Signs: BP 110/78 (BP Location: Left Arm, Patient Position: Sitting, Cuff Size: Large)   Pulse 91   Ht 5' 3"$  (1.6 m)   Wt 292 lb 6.4 oz (132.6 kg)   BMI 51.80 kg/m  GENERAL:  well appearing, in no acute distress, alert  SKIN:  Color, texture, turgor normal. No rashes or lesions HEAD:   Normocephalic/atraumatic. RESP: normal respiratory effort MSK:  No gross joint deformities.   NEUROLOGICAL: Mental Status: Alert, oriented to person, place and time, Follows commands, and Speech fluent and appropriate. Cranial Nerves: PERRL, face symmetric, no dysarthria, hearing grossly intact Motor: moves all extremities equally Gait: normal-based.  IMPRESSION: 42 year old female with a history of TMJ, HTN who presents for follow up of migraines. Headaches are relatively well-controlled on gabapentin 600 mg TID, however she continues to struggle to find an effective rescue medication. Will start naratriptan for migraine rescue. Referral to PT placed for her back pain.  PLAN: -Prevention: Continue gabapentin 600 mg TID -Rescue: Start naratriptan 2.5 mg PRN, continue Robaxin 500 mg PRN for back pain -Referral to PT for mid-back pain -Next steps: consider Qulipta, CGRP for migraine prevention  Follow-up: 7 months  I spent a total of 25 minutes on the date of the service. Headache education was done. Discussed treatment options including preventive and acute medications, and physical therapy. Discussed medication side effects, adverse reactions and drug interactions. Written educational materials and patient instructions outlining all of the above were given.  Genia Harold, MD 09/09/22 11:21 AM

## 2022-09-09 NOTE — Telephone Encounter (Signed)
Last seen on 02/06/2022 Pt follow up scheduled on 09/09/22

## 2022-09-09 NOTE — Patient Instructions (Signed)
Start naratriptan as needed for migraines. Take one pill at onset of migraine. May repeat a dose in 4 hours if needed. Max dose 2 pills in 24 hours.  Referral to physical therapy for back pain

## 2022-10-13 ENCOUNTER — Other Ambulatory Visit: Payer: Self-pay | Admitting: Psychiatry

## 2022-10-14 NOTE — Telephone Encounter (Signed)
Last seen on 09/09/22 Follow up scheduled on 04/24/23

## 2023-01-28 ENCOUNTER — Other Ambulatory Visit (HOSPITAL_COMMUNITY): Payer: Self-pay

## 2023-01-28 MED ORDER — WEGOVY 0.5 MG/0.5ML ~~LOC~~ SOAJ
0.5000 mg | SUBCUTANEOUS | 0 refills | Status: AC
  Filled 2023-01-28: qty 2, 28d supply, fill #0

## 2023-02-06 ENCOUNTER — Telehealth: Payer: Self-pay | Admitting: Psychiatry

## 2023-02-06 NOTE — Telephone Encounter (Signed)
LVM and sent mychart msg informing pt of need to reschedule 04/24/23 appt - MD out  If pt calls back to reschedule, ok to schedule with NP or can schedule with another provider who sees for symptoms pt was diagnosed with

## 2023-04-24 ENCOUNTER — Ambulatory Visit: Payer: BC Managed Care – PPO | Admitting: Psychiatry

## 2023-04-28 ENCOUNTER — Ambulatory Visit: Payer: BC Managed Care – PPO | Admitting: Dermatology

## 2023-07-08 ENCOUNTER — Other Ambulatory Visit (HOSPITAL_COMMUNITY): Payer: Self-pay

## 2023-10-01 ENCOUNTER — Ambulatory Visit: Payer: BC Managed Care – PPO | Admitting: Dermatology

## 2023-11-12 ENCOUNTER — Encounter: Payer: Self-pay | Admitting: Dermatology

## 2023-11-12 ENCOUNTER — Ambulatory Visit (INDEPENDENT_AMBULATORY_CARE_PROVIDER_SITE_OTHER): Admitting: Dermatology

## 2023-11-12 DIAGNOSIS — L209 Atopic dermatitis, unspecified: Secondary | ICD-10-CM | POA: Diagnosis not present

## 2023-11-12 DIAGNOSIS — D492 Neoplasm of unspecified behavior of bone, soft tissue, and skin: Secondary | ICD-10-CM

## 2023-11-12 DIAGNOSIS — L309 Dermatitis, unspecified: Secondary | ICD-10-CM

## 2023-11-12 DIAGNOSIS — D489 Neoplasm of uncertain behavior, unspecified: Secondary | ICD-10-CM

## 2023-11-12 DIAGNOSIS — B079 Viral wart, unspecified: Secondary | ICD-10-CM

## 2023-11-12 MED ORDER — TRIAMCINOLONE ACETONIDE 0.1 % EX CREA: 1.0000 | TOPICAL_CREAM | Freq: Two times a day (BID) | CUTANEOUS | 11 refills | Status: AC | PRN

## 2023-11-12 NOTE — Progress Notes (Signed)
 New Patient Visit   Subjective  Jeanette Santos is a 43 y.o. female who presents for the following: Eczema and spot on back of head  Patient states she has Eczema located at the scattered ( hairline , lower back and top of back that she would like to have examined. Patient reports the areas have been there for 2 years. She reports the areas are bothersome.Patient rates irritation 6 out of 10 when flared. She states that the areas have not spread. Patient reports she has previously been treated for these areas. Patient has been using Triamcinolone  cream prescribe by PCP. Patient stated that she uses [employer]Ve [employer] and [employer]. Patient stated that she has not had to use the Triamcinolone  cream since February.  Patient stated that she has a spot on the back of the head near her ear , hasn't treated with anything.  The following portions of the chart were reviewed this encounter and updated as appropriate: medications, allergies, medical history  Review of Systems:  No other skin or systemic complaints except as noted in HPI or Assessment and Plan.  Objective  Well appearing patient in no apparent distress; mood and affect are within normal limits.  A focused examination was performed of the following areas:   Relevant exam findings are noted in the Assessment and Plan.  Right Occipital Scalp 1cm marroque plaque    Assessment & Plan   ATOPIC DERMATITIS Exam: Scaly pink papules coalescing to plaques % BSA  Atopic dermatitis (eczema) is a chronic, relapsing, pruritic condition that can significantly affect quality of life. It is often associated with allergic rhinitis and/or asthma and can require treatment with topical medications, phototherapy, or in severe cases biologic injectable medication (Dupixent; Adbry) or Oral JAK inhibitors.   - Assessment: Patient has been experiencing eczema for 2 years, presenting as scaly hyperpigmented plaques typically on the  lower back, top, and hairline. 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 Triamcinolone  has been effective in managing symptoms, with relief occurring in 3-4 days for smaller spots and about 2 weeks for larger areas.  - Plan:    Refill triamcinolone  0.1% cream, 80-gram tube with 4 refills    Apply triamcinolone  for 2 weeks, followed by a 2-week break    Avoid fragrances and hot water    Use free and clear laundry detergent and moisturizers    Be mindful of hair products    Use Dove soap for bathing    Provided CeraVe moisturizers and Eucerin Advanced Repair and Eczema Therapy    Follow up annually for eczema management, or sooner if triamcinolone  becomes ineffective  2. Suspicious  neck lesion - Assessment: Patient presents with a longstanding 1-centimeter plaque on the neck, initially thought to be a burn but unresponsive to cream. Differential diagnoses include wart, seborrheic keratosis, and the need to rule out rare skin cancer. - Plan:    Perform shave biopsy of the neck lesion    Instruct patient on post-biopsy care:     - Apply Aquaphor and Band-Aid daily for a week     - Change Band-Aid once a day     - After a week, massage area with Aquaphor to minimize scarring    Await biopsy results (approximately one week)    Communicate results via MyChart if benign, or call patient if abnormal NEOPLASM OF UNCERTAIN BEHAVIOR Right Occipital Scalp Skin / nail biopsy Type of biopsy: tangential  Informed consent: discussed and consent obtained   Timeout: patient name, date of birth, surgical site, and procedure verified   Procedure prep:  Patient was prepped and draped in usual sterile fashion Prep type:  Isopropyl alcohol Anesthesia: the lesion was anesthetized in a standard fashion   Anesthetic:  1% lidocaine  w/ epinephrine 1-100,000 buffered w/ 8.4% NaHCO3 Instrument used: flexible razor blade   Hemostasis achieved with:  pressure, aluminum chloride and electrodesiccation   Outcome: patient tolerated procedure well   Post-procedure details: sterile dressing applied and wound care instructions given   Dressing type: bandage and petrolatum   Specimen 1 - Surgical pathology Differential Diagnosis: R/O ISK vs maroq  Check Margins: No  Return in about 1 year (around 11/11/2024).  Exie Holler, CMA, am acting as scribe for [employer], DO.   Documentation: I have reviewed the above documentation for accuracy and completeness, and I agree with the above.  Louana Roup, DO

## 2023-11-12 NOTE — Patient Instructions (Addendum)
 Hello Jeanette Santos,  Thank you for visiting today. Here is a summary of the key instructions:  - Medications: Use triamcinolone  0.1% cream for eczema   - Apply for 2 weeks, then take a 2-week break   - Refill: 80-gram tube with 4 refills  - Skin Care Instructions for you biopsy site:   - Use Aquaphor and a Band-Aid daily on biopsy site for a week   - After a week, massage biopsy site with Aquaphor   - Avoid fragrances in products   - Use free and clear laundry detergent and moisturizers   - Be careful with hair products   - Use Dove soap   - Avoid hot water when bathing  - Follow-up Care:   - Annual check-up for eczema   - Come in sooner if triamcinolone  doesn't work  - Biopsy Results:   - Results will be ready in about a week   - If benign, a message will be sent through MyChart   - If abnormal, you will receive a phone call  Please reach out if you have any questions or concerns.  Warm regards,  Dr. Louana Roup [facility]    Important Information  Due to recent changes in healthcare laws, you may see results of your pathology and/or laboratory studies on MyChart before the doctors have had a chance to review them. We understand that in some cases there may be results that are confusing or concerning to you. Please understand that not all results are received at the same time and often the doctors may need to interpret multiple results in order to provide you with the best plan of care or course of treatment. Therefore, we ask that you please give us  2 business days to thoroughly review all your results before contacting the office for clarification. Should we see a critical lab result, you will be contacted sooner.   If You Need Anything After Your Visit  If you have any questions or concerns for your doctor, please call our main line at [phone] If no one answers, please leave a voicemail as directed and we will return your call as soon as possible. Messages left  after 4 pm will be answered the following business day.   You may also send us  a message via MyChart. We typically respond to MyChart messages within 1-2 business days.  For prescription refills, please ask your pharmacy to contact our office. Our fax number is [phone].  If you have an urgent issue when the clinic is closed that cannot wait until the next business day, you can page your doctor at the number below.    Please note that while we do our best to be available for urgent issues outside of office hours, we are not available 24/7.   If you have an urgent issue and are unable to reach us , you may choose to seek medical care at your doctor's office, retail clinic, urgent care center, or emergency room.  If you have a medical emergency, please immediately call 911 or go to the emergency department. In the event of inclement weather, please call our main line at [phone] for an update on the status of any delays or closures.  Dermatology Medication Tips: Please keep the boxes that topical medications come in in order to help keep track of the instructions about where and how to use these. Pharmacies typically print the medication instructions only on the boxes and not directly on the medication tubes.   If your  medication is too expensive, please contact our office at [phone] or send us  a message through MyChart.   We are unable to tell what your co-pay for medications will be in advance as this is different depending on your insurance coverage. However, we may be able to find a substitute medication at lower cost or fill out paperwork to get insurance to cover a needed medication.   If a prior authorization is required to get your medication covered by your insurance company, please allow us  1-2 business days to complete this process.  Drug prices often vary depending on where the prescription is filled and some pharmacies may offer cheaper prices.  The website  www.goodrx.com contains coupons for medications through different pharmacies. The prices here do not account for what the cost may be with help from insurance (it may be cheaper with your insurance), but the website can give you the price if you did not use any insurance.  - You can print the associated coupon and take it with your prescription to the pharmacy.  - You may also stop by our office during regular business hours and pick up a GoodRx coupon card.  - If you need your prescription sent electronically to a different pharmacy, notify our office through [facility] or by phone at [phone]

## 2023-11-17 LAB — SURGICAL PATHOLOGY

## 2023-11-20 ENCOUNTER — Encounter: Payer: Self-pay | Admitting: Dermatology

## 2023-11-24 ENCOUNTER — Encounter: Payer: Self-pay | Admitting: Dermatology

## 2023-11-24 NOTE — Telephone Encounter (Signed)
 Results were signed and released to pt this morning.  :)

## 2024-11-11 ENCOUNTER — Ambulatory Visit: Admitting: Dermatology
